# Patient Record
Sex: Female | Born: 1980 | Race: Black or African American | Hispanic: No | Marital: Single | Smoking: Current every day smoker
Health system: Southern US, Community
[De-identification: ages and names within clinical notes are randomized; demographics above are authoritative.]

## PROBLEM LIST (undated history)

## (undated) DIAGNOSIS — A549 Gonococcal infection, unspecified: Secondary | ICD-10-CM

## (undated) HISTORY — PX: DILATION AND CURETTAGE OF UTERUS: SHX78

---
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
## 2008-08-30 ENCOUNTER — Emergency Department (HOSPITAL_BASED_OUTPATIENT_CLINIC_OR_DEPARTMENT_OTHER): Admission: EM | Admit: 2008-08-30 | Discharge: 2008-08-30 | Payer: Self-pay | Admitting: Emergency Medicine

## 2008-09-30 ENCOUNTER — Emergency Department (HOSPITAL_BASED_OUTPATIENT_CLINIC_OR_DEPARTMENT_OTHER): Admission: EM | Admit: 2008-09-30 | Discharge: 2008-09-30 | Payer: Self-pay | Admitting: *Deleted

## 2008-10-18 ENCOUNTER — Emergency Department (HOSPITAL_BASED_OUTPATIENT_CLINIC_OR_DEPARTMENT_OTHER): Admission: EM | Admit: 2008-10-18 | Discharge: 2008-10-18 | Payer: Self-pay | Admitting: Emergency Medicine

## 2008-11-26 ENCOUNTER — Ambulatory Visit: Payer: Self-pay | Admitting: Diagnostic Radiology

## 2008-11-26 ENCOUNTER — Emergency Department (HOSPITAL_BASED_OUTPATIENT_CLINIC_OR_DEPARTMENT_OTHER): Admission: EM | Admit: 2008-11-26 | Discharge: 2008-11-27 | Payer: Self-pay | Admitting: Emergency Medicine

## 2008-12-04 ENCOUNTER — Emergency Department (HOSPITAL_BASED_OUTPATIENT_CLINIC_OR_DEPARTMENT_OTHER): Admission: EM | Admit: 2008-12-04 | Discharge: 2008-12-04 | Payer: Self-pay | Admitting: Emergency Medicine

## 2008-12-06 ENCOUNTER — Emergency Department (HOSPITAL_BASED_OUTPATIENT_CLINIC_OR_DEPARTMENT_OTHER): Admission: EM | Admit: 2008-12-06 | Discharge: 2008-12-06 | Payer: Self-pay | Admitting: Emergency Medicine

## 2009-01-03 ENCOUNTER — Emergency Department (HOSPITAL_BASED_OUTPATIENT_CLINIC_OR_DEPARTMENT_OTHER): Admission: EM | Admit: 2009-01-03 | Discharge: 2009-01-03 | Payer: Self-pay | Admitting: Emergency Medicine

## 2009-12-26 ENCOUNTER — Emergency Department (HOSPITAL_BASED_OUTPATIENT_CLINIC_OR_DEPARTMENT_OTHER): Admission: EM | Admit: 2009-12-26 | Discharge: 2009-12-26 | Payer: Self-pay | Admitting: Emergency Medicine

## 2010-01-25 IMAGING — CR DG WRIST COMPLETE 3+V*R*
4 series · 4 of 4 positions shown · non-contrast
Comparison: None available.

CLINICAL DATA: Injury, pain.

RIGHT WRIST - COMPLETE 3+ VIEW

[x wrist lat right]
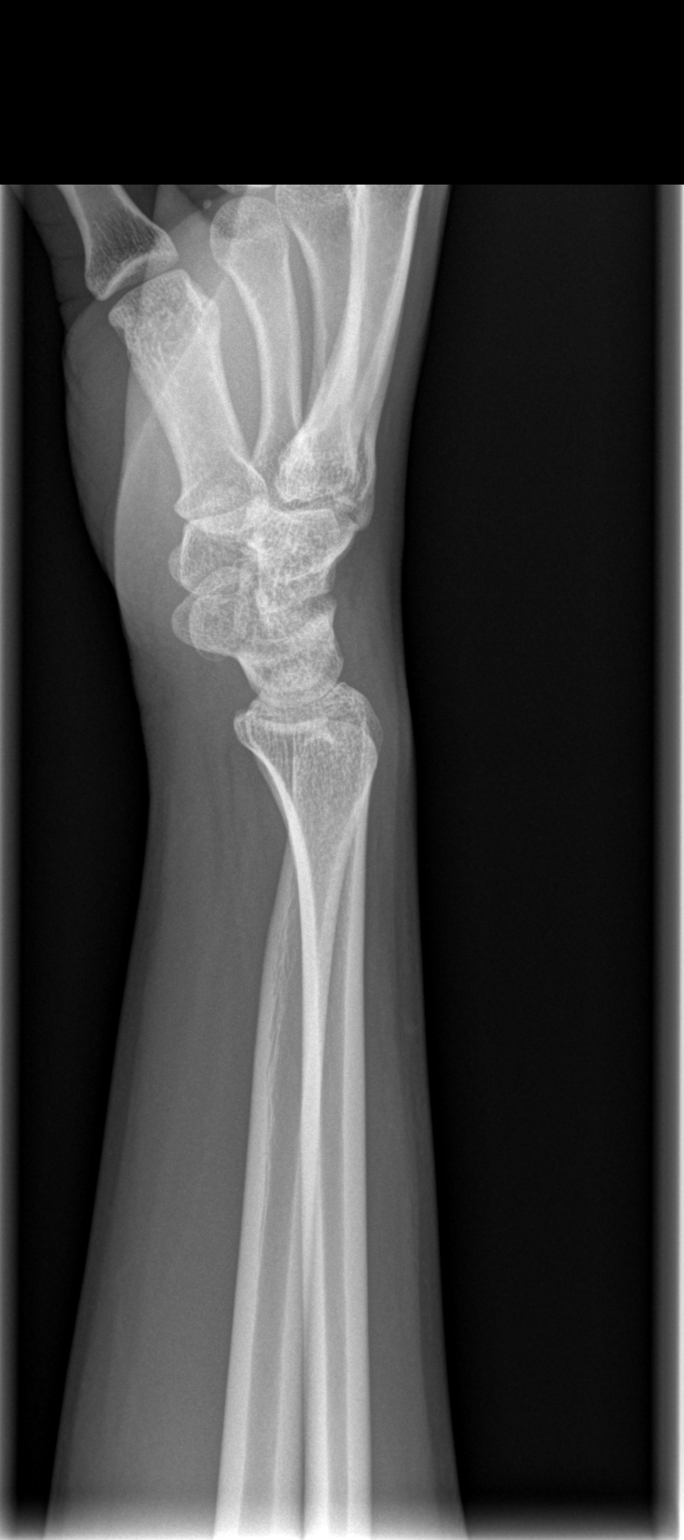

[x wrist pa right]
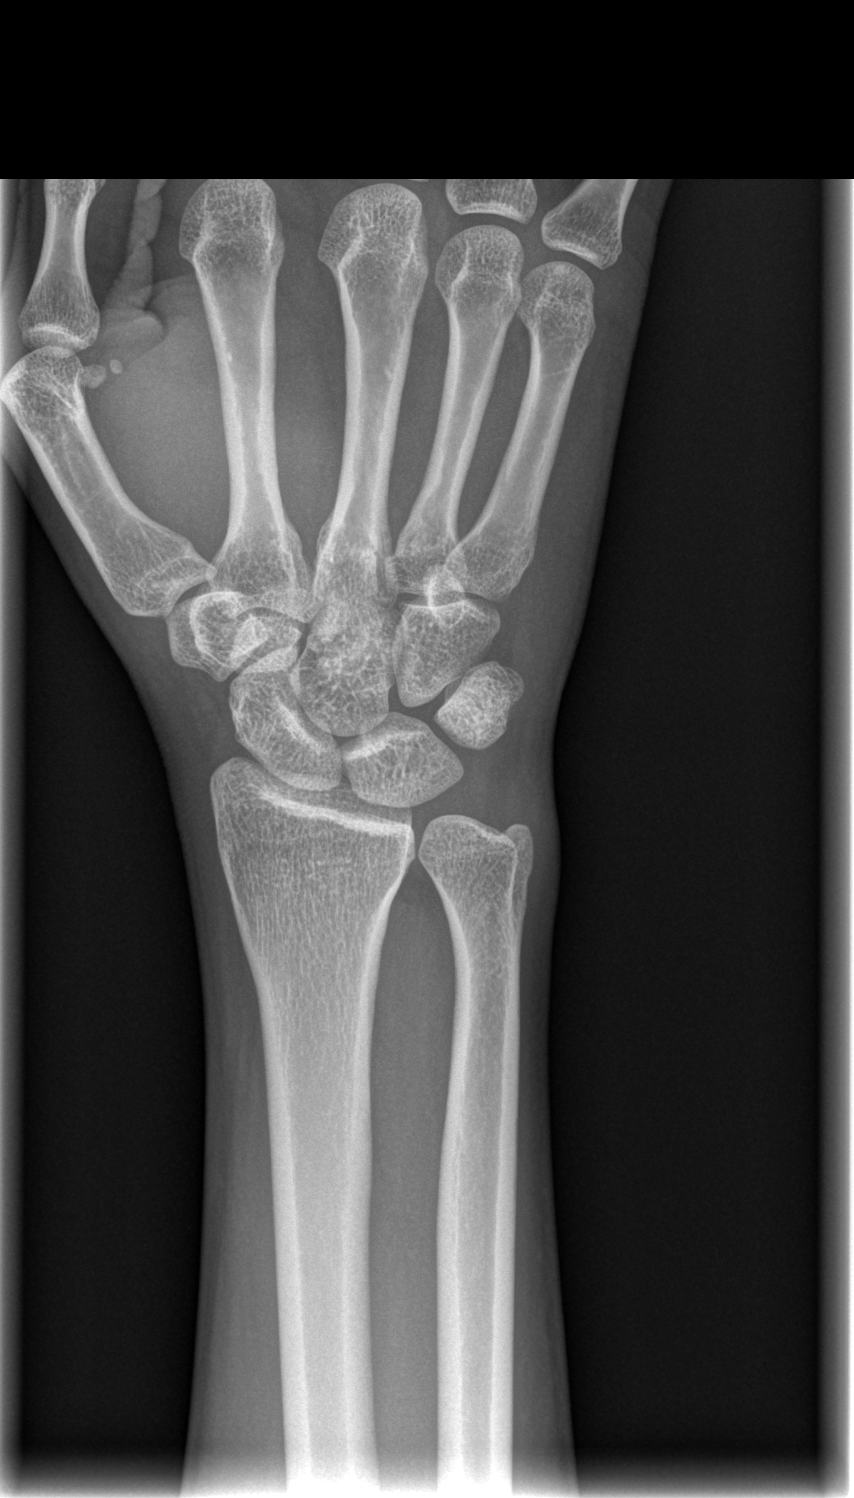

[x wrist obl right]
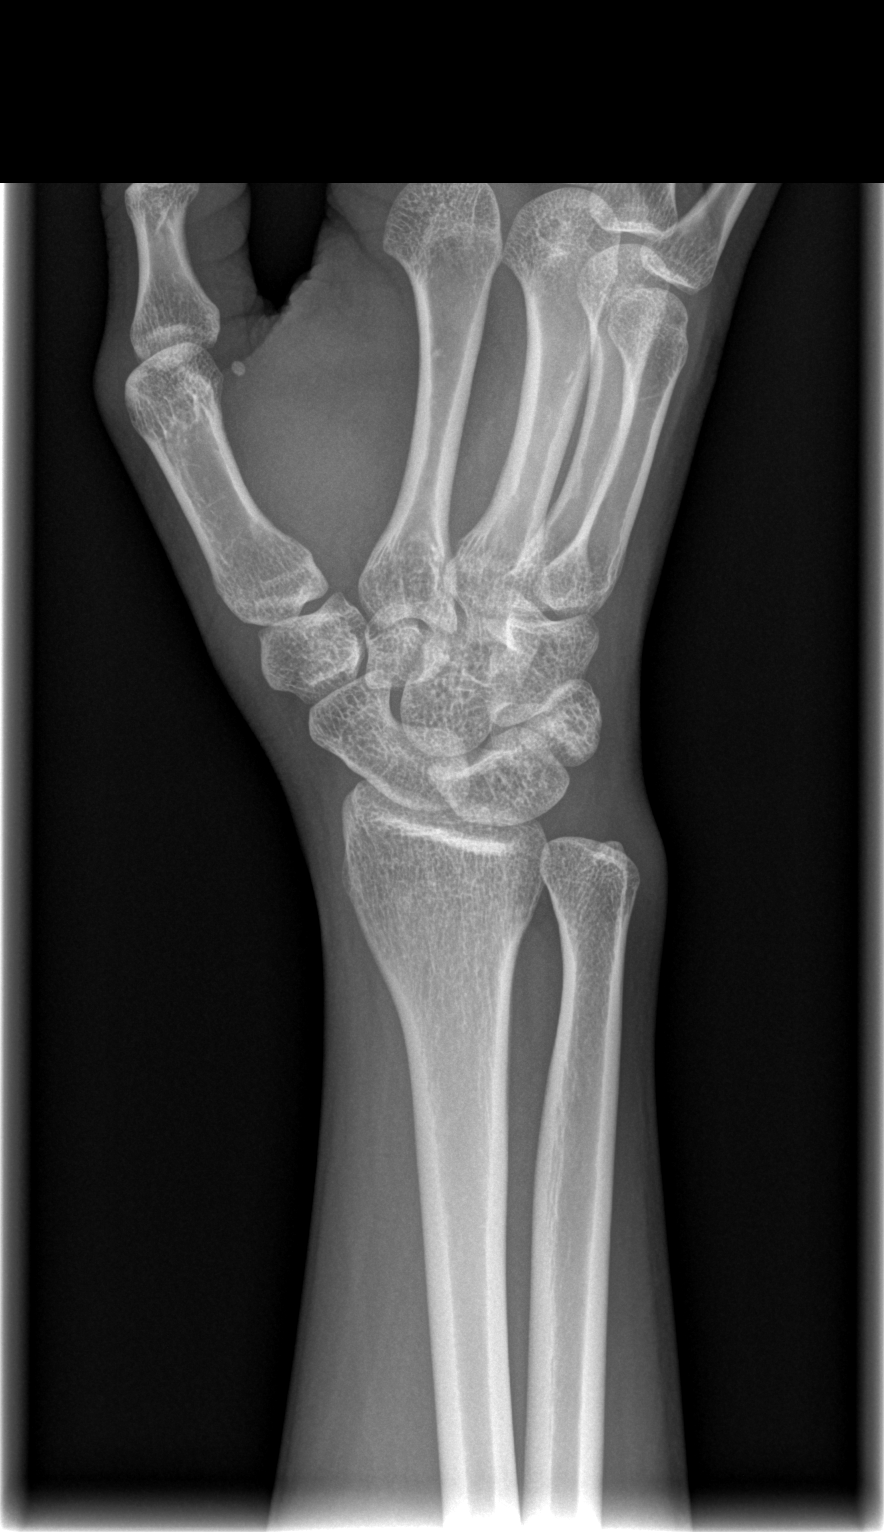

[x navicular]
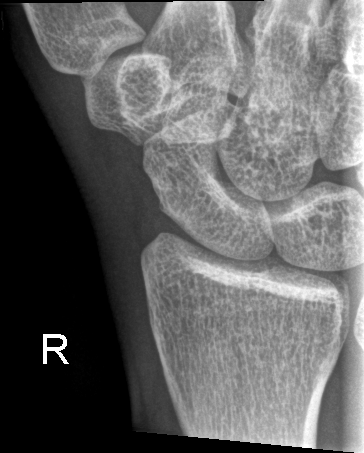

[4 of 4 positions shown; findings below may reference images not displayed]

FINDINGS: Imaged bones, joints and soft tissues appear normal.
IMPRESSION: Negative exam.

## 2010-01-25 IMAGING — CR DG HAND COMPLETE 3+V*R*
3 series · 3 of 3 positions shown · non-contrast
Comparison: None available.

CLINICAL DATA: Right hand injury and pain.

RIGHT HAND - COMPLETE 3+ VIEW

[x hand pa right]
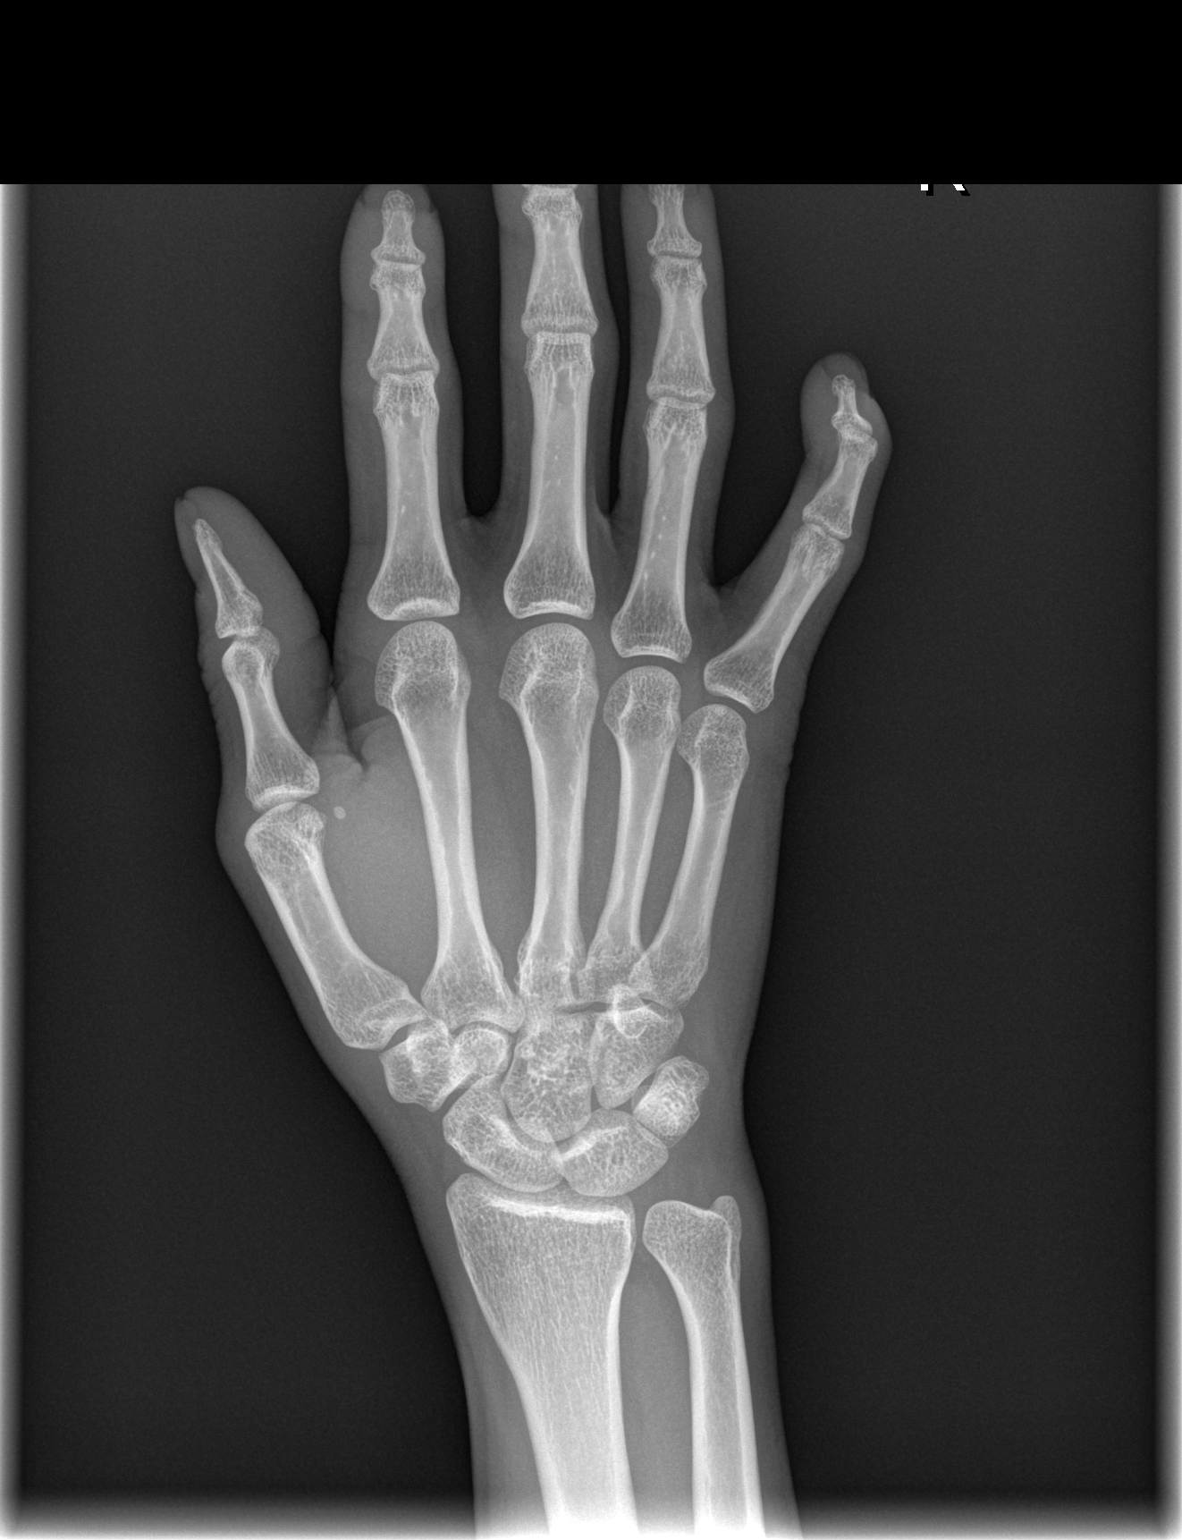

[x hand oblique right]
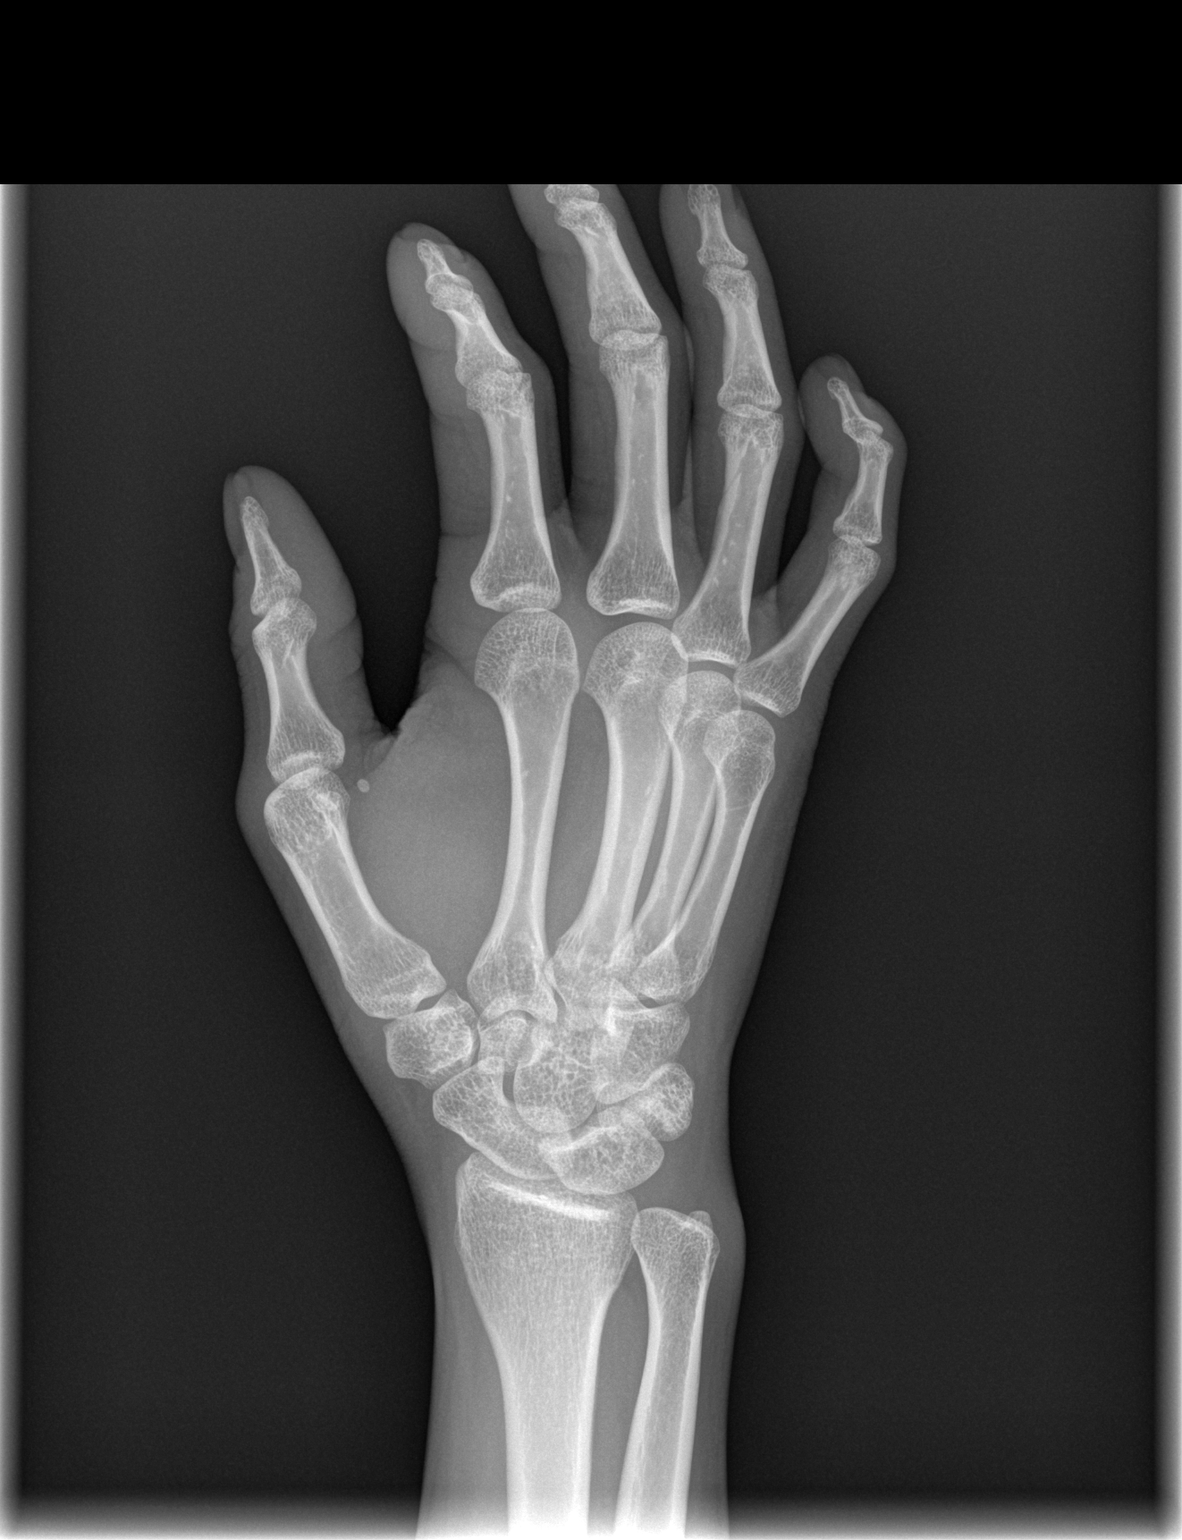

[x hand lat right]
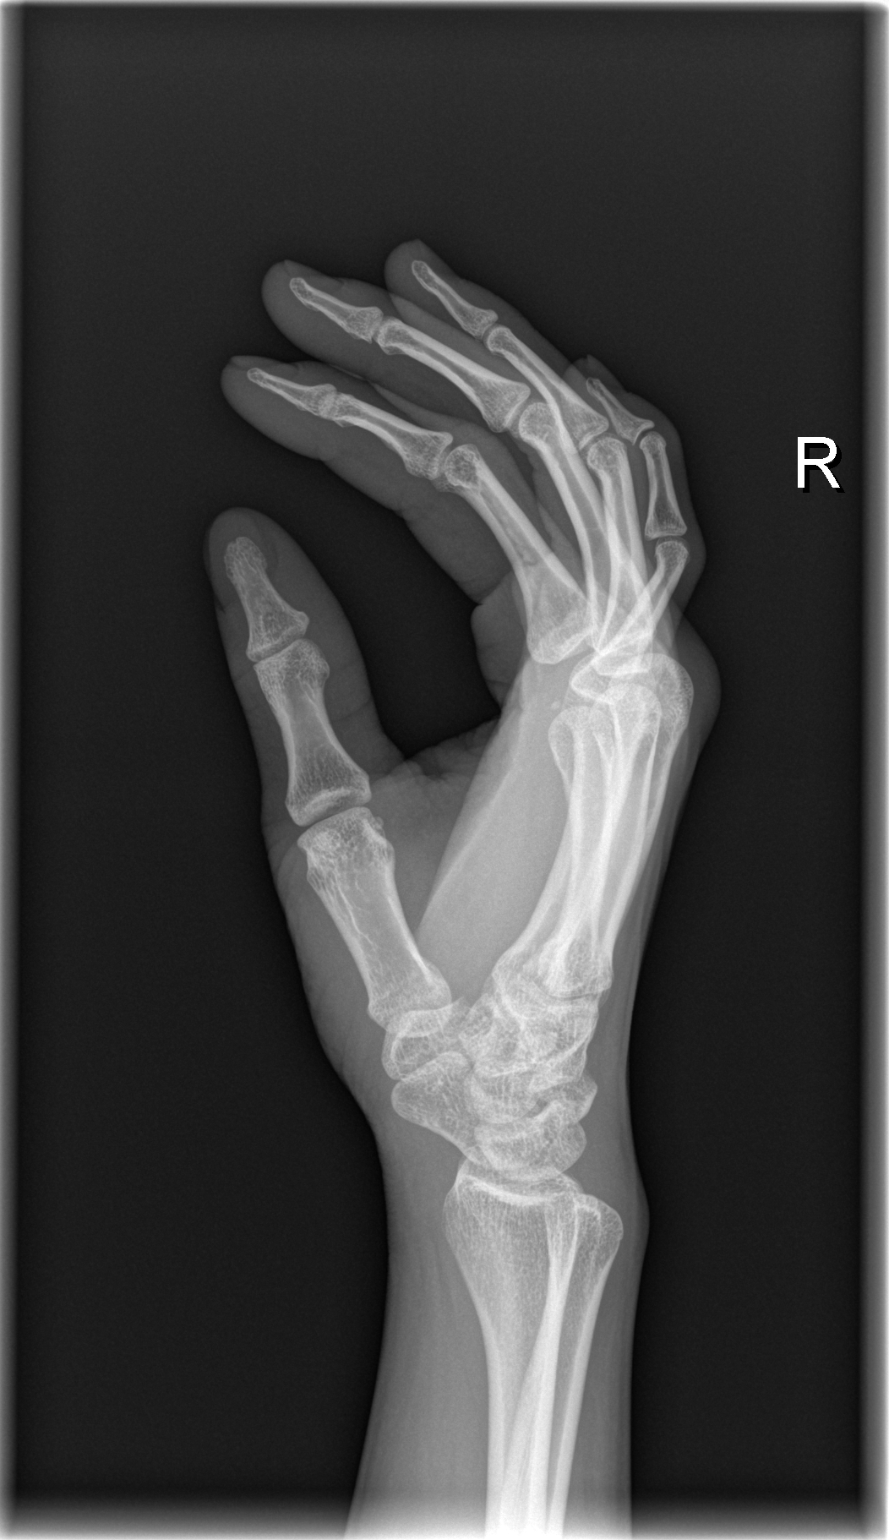

[3 of 3 positions shown; findings below may reference images not displayed]

FINDINGS: Imaged bones, joints and soft tissues appear normal.
IMPRESSION: Negative exam.

## 2010-06-02 LAB — URINALYSIS, ROUTINE W REFLEX MICROSCOPIC
Glucose, UA: NEGATIVE mg/dL
Ketones, ur: NEGATIVE mg/dL
Nitrite: NEGATIVE
Protein, ur: NEGATIVE mg/dL
Urobilinogen, UA: 0.2 mg/dL (ref 0.0–1.0)

## 2010-06-02 LAB — PREGNANCY, URINE: Preg Test, Ur: NEGATIVE

## 2010-06-02 LAB — URINE MICROSCOPIC-ADD ON

## 2011-11-28 ENCOUNTER — Encounter (HOSPITAL_BASED_OUTPATIENT_CLINIC_OR_DEPARTMENT_OTHER): Payer: Self-pay | Admitting: Emergency Medicine

## 2011-11-28 ENCOUNTER — Emergency Department (HOSPITAL_BASED_OUTPATIENT_CLINIC_OR_DEPARTMENT_OTHER)
Admission: EM | Admit: 2011-11-28 | Discharge: 2011-11-28 | Disposition: A | Discharge status: Home / Self Care | Payer: Self-pay | Attending: Emergency Medicine | Admitting: Emergency Medicine

## 2011-11-28 DIAGNOSIS — A599 Trichomoniasis, unspecified: Secondary | ICD-10-CM

## 2011-11-28 DIAGNOSIS — B9689 Other specified bacterial agents as the cause of diseases classified elsewhere: Secondary | ICD-10-CM

## 2011-11-28 DIAGNOSIS — A499 Bacterial infection, unspecified: Secondary | ICD-10-CM | POA: Insufficient documentation

## 2011-11-28 DIAGNOSIS — N760 Acute vaginitis: Secondary | ICD-10-CM | POA: Insufficient documentation

## 2011-11-28 HISTORY — DX: Gonococcal infection, unspecified: A54.9

## 2011-11-28 LAB — URINALYSIS, ROUTINE W REFLEX MICROSCOPIC
Bilirubin Urine: NEGATIVE
Glucose, UA: NEGATIVE mg/dL
Hgb urine dipstick: NEGATIVE
Ketones, ur: NEGATIVE mg/dL
Nitrite: NEGATIVE
Protein, ur: NEGATIVE mg/dL
Specific Gravity, Urine: 1.006 (ref 1.005–1.030)
Urobilinogen, UA: 0.2 mg/dL (ref 0.0–1.0)
pH: 5.5 (ref 5.0–8.0)

## 2011-11-28 LAB — URINE MICROSCOPIC-ADD ON

## 2011-11-28 LAB — PREGNANCY, URINE: Preg Test, Ur: NEGATIVE

## 2011-11-28 LAB — WET PREP, GENITAL: Yeast Wet Prep HPF POC: NONE SEEN

## 2011-11-28 MED ORDER — METRONIDAZOLE 500 MG PO TABS: 500.0000 mg | ORAL_TABLET | Freq: Two times a day (BID) | ORAL | Status: AC

## 2011-11-28 NOTE — ED Notes (Signed)
MD at bedside. 

## 2011-11-28 NOTE — Discharge Instructions (Signed)
Bacterial Vaginosis Bacterial vaginosis (BV) is a vaginal infection where the normal balance of bacteria in the vagina is disrupted. The normal balance is then replaced by an overgrowth of certain bacteria. There are several different kinds of bacteria that can cause BV. BV is the most common vaginal infection in women of childbearing age. CAUSES   The cause of BV is not fully understood. BV develops when there is an increase or imbalance of harmful bacteria.   Some activities or behaviors can upset the normal balance of bacteria in the vagina and put women at increased risk including:   Having a new sex partner or multiple sex partners.   Douching.   Using an intrauterine device (IUD) for contraception.   It is not clear what role sexual activity plays in the development of BV. However, women that have never had sexual intercourse are rarely infected with BV.  Women do not get BV from toilet seats, bedding, swimming pools or from touching objects around them.  SYMPTOMS   Grey vaginal discharge.   A fish-like odor with discharge, especially after sexual intercourse.   Itching or burning of the vagina and vulva.   Burning or pain with urination.   Some women have no signs or symptoms at all.  DIAGNOSIS  Your caregiver must examine the vagina for signs of BV. Your caregiver will perform lab tests and look at the sample of vaginal fluid through a microscope. They will look for bacteria and abnormal cells (clue cells), a pH test higher than 4.5, and a positive amine test all associated with BV.  RISKS AND COMPLICATIONS   Pelvic inflammatory disease (PID).   Infections following gynecology surgery.   Developing HIV.   Developing herpes virus.  TREATMENT  Sometimes BV will clear up without treatment. However, all women with symptoms of BV should be treated to avoid complications, especially if gynecology surgery is planned. Female partners generally do not need to be treated. However,  BV may spread between female sex partners so treatment is helpful in preventing a recurrence of BV.   BV may be treated with antibiotics. The antibiotics come in either pill or vaginal cream forms. Either can be used with nonpregnant or pregnant women, but the recommended dosages differ. These antibiotics are not harmful to the baby.   BV can recur after treatment. If this happens, a second round of antibiotics will often be prescribed.   Treatment is important for pregnant women. If not treated, BV can cause a premature delivery, especially for a pregnant woman who had a premature birth in the past. All pregnant women who have symptoms of BV should be checked and treated.   For chronic reoccurrence of BV, treatment with a type of prescribed gel vaginally twice a week is helpful.  HOME CARE INSTRUCTIONS   Finish all medication as directed by your caregiver.   Do not have sex until treatment is completed.   Tell your sexual partner that you have a vaginal infection. They should see their caregiver and be treated if they have problems, such as a mild rash or itching.   Practice safe sex. Use condoms. Only have 1 sex partner.  PREVENTION  Basic prevention steps can help reduce the risk of upsetting the natural balance of bacteria in the vagina and developing BV:  Do not have sexual intercourse (be abstinent).   Do not douche.   Use all of the medicine prescribed for treatment of BV, even if the signs and symptoms go away.     Tell your sex partner if you have BV. That way, they can be treated, if needed, to prevent reoccurrence.  SEEK MEDICAL CARE IF:   Your symptoms are not improving after 3 days of treatment.   You have increased discharge, pain, or fever.  MAKE SURE YOU:   Understand these instructions.   Will watch your condition.   Will get help right away if you are not doing well or get worse.  FOR MORE INFORMATION  Division of STD Prevention (DSTDP), Centers for Disease  Control and Prevention: www.cdc.gov/std [employer] Association (ASHA): www.ashastd.org  Document Released: 03/06/2005 Document Revised: 02/23/2011 Document Reviewed: 08/27/2008 ExitCare Patient Information 2012 [employer].  Trichomoniasis Trichomoniasis is an infection, caused by the Trichomonas organism, that affects both women and men. In women, the outer female genitalia and the vagina are affected. In men, the penis is mainly affected, but the prostate and other reproductive organs can also be involved. Trichomoniasis is a sexually transmitted disease (STD) and is most often passed to another person through sexual contact. The majority of people who get trichomoniasis do so from a sexual encounter and are also at risk for other STDs. CAUSES   Sexual intercourse with an infected partner.   It can be present in swimming pools or hot tubs.  SYMPTOMS   Abnormal gray-green frothy vaginal discharge in women.   Vaginal itching and irritation in women.   Itching and irritation of the area outside the vagina in women.   Penile discharge with or without pain in males.   Inflammation of the urethra (urethritis), causing painful urination.   Bleeding after sexual intercourse.  RELATED COMPLICATIONS  Pelvic inflammatory disease.   Infection of the uterus (endometritis).   Infertility.   Tubal (ectopic) pregnancy.   It can be associated with other STDs, including gonorrhea and chlamydia, hepatitis B, and HIV.  COMPLICATIONS DURING PREGNANCY  Early (premature) delivery.   Premature rupture of the membranes (PROM).   Low birth weight.  DIAGNOSIS   Visualization of Trichomonas under the microscope from the vagina discharge.   Ph of the vagina greater than 4.5, tested with a test tape.   Trich Rapid Test.   Culture of the organism, but this is not usually needed.   It may be found on a Pap test.   Having a "strawberry cervix,"which means the cervix looks very  red like a strawberry.  TREATMENT   You may be given medication to fight the infection. Inform your caregiver if you could be or are pregnant. Some medications used to treat the infection should not be taken during pregnancy.   Over-the-counter medications or creams to decrease itching or irritation may be recommended.   Your sexual partner will need to be treated if infected.  HOME CARE INSTRUCTIONS   Take all medication prescribed by your caregiver.   Take over-the-counter medication for itching or irritation as directed by your caregiver.   Do not have sexual intercourse while you have the infection.   Do not douche or wear tampons.   Discuss your infection with your partner, as your partner may have acquired the infection from you. Or, your partner may have been the person who transmitted the infection to you.   Have your sex partner examined and treated if necessary.   Practice safe, informed, and protected sex.   See your caregiver for other STD testing.  SEEK MEDICAL CARE IF:   You still have symptoms after you finish the medication.   You have   an oral temperature above 102 F (38.9 C).   You develop belly (abdominal) pain.   You have pain when you urinate.   You have bleeding after sexual intercourse.   You develop a rash.   The medication makes you sick or makes you throw up (vomit).  Document Released: 08/30/2000 Document Revised: 02/23/2011 Document Reviewed: 09/25/2008 ExitCare Patient Information 2012 [employer]. 

## 2011-11-28 NOTE — ED Notes (Signed)
Pt c/o yellow vaginal discharge and vaginal burning.

## 2011-11-28 NOTE — ED Provider Notes (Signed)
History     CSN: [id number]  Arrival date & time 11/28/11  0212   First MD Initiated Contact with Patient 11/28/11 0335      Chief Complaint  Patient presents with  . Vaginal Discharge    (Consider location/radiation/quality/duration/timing/severity/associated sxs/prior treatment) HPI Comments: Ptt reports has had gonorrhea in the past.  No fever, pelvic pain.  Has no MD.  Same partner, no bleeding.    Patient is a 31 y.o. female presenting with vaginal discharge. The history is provided by the patient.  Vaginal Discharge The current episode started more than 1 week ago. The problem occurs constantly. The problem has not changed since onset.Pertinent negatives include no abdominal pain. Nothing aggravates the symptoms. Nothing relieves the symptoms. She has tried nothing for the symptoms. The treatment provided no relief.    Past Medical History  Diagnosis Date  . Gonorrhea     History reviewed. No pertinent past surgical history.  History reviewed. No pertinent family history.  History  Substance Use Topics  . Smoking status: Not on file  . Smokeless tobacco: Not on file  . Alcohol Use:     OB History    Grav Para Term Preterm Abortions TAB SAB Ect Mult Living                  Review of Systems  Constitutional: Negative for fever and chills.  Gastrointestinal: Negative for nausea, vomiting and abdominal pain.  Genitourinary: Positive for vaginal discharge. Negative for pelvic pain.  Musculoskeletal: Negative for back pain.  Skin: Negative for rash.    Allergies  Review of patient's allergies indicates no known allergies.  Home Medications   Current Outpatient Rx  Name Route Sig Dispense Refill  . HYDRALAZINE HCL 10 MG PO TABS Oral Take by mouth.    . METRONIDAZOLE 500 MG PO TABS Oral Take 1 tablet (500 mg total) by mouth 2 (two) times daily. 20 tablet 0    BP 120/71  Pulse 110  Temp 98.1 F (36.7 C) (Oral)  Resp 20  Ht 5\' 3"  (1.6 m)  Wt 104 lb  (47.174 kg)  BMI 18.42 kg/m2  SpO2 100%  Physical Exam  Nursing note and vitals reviewed. Constitutional: She appears well-developed and well-nourished. No distress.  Abdominal: Soft. She exhibits no distension. There is no tenderness. There is no rebound and no guarding.  Genitourinary: No breast swelling or tenderness. Pelvic exam was performed with patient prone. There is no rash on the right labia. There is no rash on the left labia. Cervix exhibits discharge and friability. Right adnexum displays no tenderness. Left adnexum displays no tenderness. No erythema, tenderness or bleeding around the vagina. No foreign body around the vagina. No signs of injury around the vagina. Vaginal discharge found.       Mild thick white discharge in vagina, minimal yellowish discharge from cervix  Skin: Skin is warm and dry. No rash noted.    ED Course  Procedures (including critical care time)  Labs Reviewed  URINALYSIS, ROUTINE W REFLEX MICROSCOPIC - Abnormal; Notable for the following:    APPearance CLOUDY (*)     Leukocytes, UA LARGE (*)     All other components within normal limits  URINE MICROSCOPIC-ADD ON - Abnormal; Notable for the following:    Squamous Epithelial / LPF MANY (*)     All other components within normal limits  WET PREP, GENITAL - Abnormal; Notable for the following:    Trich, Wet Prep FEW (*)  Clue Cells Wet Prep HPF POC MODERATE (*)     WBC, Wet Prep HPF POC MANY (*)     All other components within normal limits  PREGNANCY, URINE  GC/CHLAMYDIA PROBE AMP, GENITAL   No results found.   1. Trichimoniasis   2. BV (bacterial vaginosis)       MDM  No fever, not sig tender.  Wet prep and cultures sent.  Pt told that cultures would be done in 1-2 days, would be called in if she has STD.  Low suspicion.          Gavin Pound. Oletta Lamas, MD 11/28/11 [phone]

## 2011-11-28 NOTE — ED Notes (Signed)
Pt states "I think I have gonorrhea". Denies any urinary sxs.

## 2011-11-29 LAB — GC/CHLAMYDIA PROBE AMP, GENITAL
Chlamydia, DNA Probe: NEGATIVE
GC Probe Amp, Genital: NEGATIVE

## 2015-07-24 ENCOUNTER — Emergency Department (HOSPITAL_BASED_OUTPATIENT_CLINIC_OR_DEPARTMENT_OTHER)
Admission: EM | Admit: 2015-07-24 | Discharge: 2015-07-24 | Disposition: A | Discharge status: Home / Self Care | Payer: Medicaid Other | Attending: Emergency Medicine | Admitting: Emergency Medicine

## 2015-07-24 ENCOUNTER — Encounter (HOSPITAL_BASED_OUTPATIENT_CLINIC_OR_DEPARTMENT_OTHER): Payer: Self-pay | Admitting: Emergency Medicine

## 2015-07-24 DIAGNOSIS — R1013 Epigastric pain: Secondary | ICD-10-CM | POA: Insufficient documentation

## 2015-07-24 DIAGNOSIS — F17210 Nicotine dependence, cigarettes, uncomplicated: Secondary | ICD-10-CM | POA: Insufficient documentation

## 2015-07-24 DIAGNOSIS — Z79899 Other long term (current) drug therapy: Secondary | ICD-10-CM | POA: Insufficient documentation

## 2015-07-24 DIAGNOSIS — R112 Nausea with vomiting, unspecified: Secondary | ICD-10-CM

## 2015-07-24 DIAGNOSIS — R197 Diarrhea, unspecified: Secondary | ICD-10-CM | POA: Insufficient documentation

## 2015-07-24 LAB — CBC WITH DIFFERENTIAL/PLATELET
BASOS PCT: 0 %
Basophils Absolute: 0 10*3/uL (ref 0.0–0.1)
EOS ABS: 0.1 10*3/uL (ref 0.0–0.7)
Eosinophils Relative: 2 %
HCT: 41.3 % (ref 36.0–46.0)
Hemoglobin: 13.8 g/dL (ref 12.0–15.0)
Lymphocytes Relative: 21 %
Lymphs Abs: 1.3 10*3/uL (ref 0.7–4.0)
MCH: 28.6 pg (ref 26.0–34.0)
MCHC: 33.4 g/dL (ref 30.0–36.0)
MCV: 85.7 fL (ref 78.0–100.0)
MONO ABS: 0.4 10*3/uL (ref 0.1–1.0)
MONOS PCT: 6 %
Neutro Abs: 4.3 10*3/uL (ref 1.7–7.7)
Neutrophils Relative %: 71 %
Platelets: 250 10*3/uL (ref 150–400)
RBC: 4.82 MIL/uL (ref 3.87–5.11)
RDW: 13.5 % (ref 11.5–15.5)
WBC: 6.1 10*3/uL (ref 4.0–10.5)

## 2015-07-24 LAB — URINALYSIS, ROUTINE W REFLEX MICROSCOPIC
Bilirubin Urine: NEGATIVE
Glucose, UA: NEGATIVE mg/dL
Hgb urine dipstick: NEGATIVE
KETONES UR: NEGATIVE mg/dL
LEUKOCYTES UA: NEGATIVE
NITRITE: NEGATIVE
PH: 8 (ref 5.0–8.0)
Protein, ur: NEGATIVE mg/dL
Specific Gravity, Urine: 1.011 (ref 1.005–1.030)

## 2015-07-24 LAB — HCG, SERUM, QUALITATIVE: Preg, Serum: NEGATIVE

## 2015-07-24 LAB — COMPREHENSIVE METABOLIC PANEL
ALBUMIN: 3.6 g/dL (ref 3.5–5.0)
ALT: 13 U/L — ABNORMAL LOW (ref 14–54)
ANION GAP: 7 (ref 5–15)
AST: 20 U/L (ref 15–41)
Alkaline Phosphatase: 60 U/L (ref 38–126)
BUN: 8 mg/dL (ref 6–20)
CO2: 27 mmol/L (ref 22–32)
Calcium: 8.9 mg/dL (ref 8.9–10.3)
Chloride: 103 mmol/L (ref 101–111)
Creatinine, Ser: 0.8 mg/dL (ref 0.44–1.00)
GFR calc Af Amer: >60 mL/min (ref >60)
GFR calc non Af Amer: >60 mL/min (ref >60)
GLUCOSE: 93 mg/dL (ref 65–99)
POTASSIUM: 3.9 mmol/L (ref 3.5–5.1)
SODIUM: 137 mmol/L (ref 135–145)
TOTAL PROTEIN: 6.7 g/dL (ref 6.5–8.1)
Total Bilirubin: 0.2 mg/dL — ABNORMAL LOW (ref 0.3–1.2)

## 2015-07-24 LAB — LIPASE, BLOOD: Lipase: 19 U/L (ref 11–51)

## 2015-07-24 MED ORDER — FAMOTIDINE 20 MG PO TABS: 20.0000 mg | ORAL_TABLET | Freq: Two times a day (BID) | ORAL | Status: AC

## 2015-07-24 MED ORDER — GI COCKTAIL ~~LOC~~
30.0000 mL | Freq: Once | ORAL | Status: AC
  Administered 2015-07-24: 30 mL via ORAL
  Filled 2015-07-24: qty 30

## 2015-07-24 MED ORDER — FAMOTIDINE IN NACL 20-0.9 MG/50ML-% IV SOLN
20.0000 mg | Freq: Once | INTRAVENOUS | Status: AC
  Administered 2015-07-24: 20 mg via INTRAVENOUS
  Filled 2015-07-24: qty 50

## 2015-07-24 MED ORDER — ONDANSETRON HCL 4 MG PO TABS: 4.0000 mg | ORAL_TABLET | Freq: Three times a day (TID) | ORAL | Status: AC | PRN

## 2015-07-24 MED ORDER — ACETAMINOPHEN 325 MG PO TABS
650.0000 mg | ORAL_TABLET | Freq: Once | ORAL | Status: AC
  Administered 2015-07-24: 650 mg via ORAL
  Filled 2015-07-24: qty 2

## 2015-07-24 MED ORDER — ONDANSETRON HCL 4 MG/2ML IJ SOLN
4.0000 mg | Freq: Once | INTRAMUSCULAR | Status: AC
  Administered 2015-07-24: 4 mg via INTRAVENOUS
  Filled 2015-07-24: qty 2

## 2015-07-24 MED ORDER — SODIUM CHLORIDE 0.9 % IV BOLUS (SEPSIS)
1000.0000 mL | Freq: Once | INTRAVENOUS | Status: AC
  Administered 2015-07-24: 1000 mL via INTRAVENOUS

## 2015-07-24 NOTE — ED Notes (Signed)
Pt reports severe abd cramps with menstrual cycle. [facility] (reports having CT scan and blood work at that time and states they said everything was negative). Pt reports she's seeking care today d/t constant pain, n/v/d. Reports dark stool. Denies fever.

## 2015-07-24 NOTE — ED Notes (Signed)
MD at bedside. 

## 2015-07-24 NOTE — Discharge Instructions (Signed)
Your blood work today is very reassuring. Please continue to drink plenty of fluids and get plenty of rest at home. Take medications as prescribed. Return for worsening symptoms, including fever, worsening pain, vomiting and unable to keep down food or fluids despite nausea medications, or any other symptoms concerning to you.  Nausea and Vomiting Nausea is a sick feeling that often comes before throwing up (vomiting). Vomiting is a reflex where stomach contents come out of your mouth. Vomiting can cause severe loss of body fluids (dehydration). Children and elderly adults can become dehydrated quickly, especially if they also have diarrhea. Nausea and vomiting are symptoms of a condition or disease. It is important to find the cause of your symptoms. CAUSES   Direct irritation of the stomach lining. This irritation can result from increased acid production (gastroesophageal reflux disease), infection, food poisoning, taking certain medicines (such as nonsteroidal anti-inflammatory drugs), alcohol use, or tobacco use.  Signals from the brain.These signals could be caused by a headache, heat exposure, an inner ear disturbance, increased pressure in the brain from injury, infection, a tumor, or a concussion, pain, emotional stimulus, or metabolic problems.  An obstruction in the gastrointestinal tract (bowel obstruction).  Illnesses such as diabetes, hepatitis, gallbladder problems, appendicitis, kidney problems, cancer, sepsis, atypical symptoms of a heart attack, or eating disorders.  Medical treatments such as chemotherapy and radiation.  Receiving medicine that makes you sleep (general anesthetic) during surgery. DIAGNOSIS Your caregiver may ask for tests to be done if the problems do not improve after a few days. Tests may also be done if symptoms are severe or if the reason for the nausea and vomiting is not clear. Tests may include:  Urine tests.  Blood tests.  Stool tests.  Cultures  (to look for evidence of infection).  X-rays or other imaging studies. Test results can help your caregiver make decisions about treatment or the need for additional tests. TREATMENT You need to stay well hydrated. Drink frequently but in small amounts.You may wish to drink water, sports drinks, clear broth, or eat frozen ice pops or gelatin dessert to help stay hydrated.When you eat, eating slowly may help prevent nausea.There are also some antinausea medicines that may help prevent nausea. HOME CARE INSTRUCTIONS   Take all medicine as directed by your caregiver.  If you do not have an appetite, do not force yourself to eat. However, you must continue to drink fluids.  If you have an appetite, eat a normal diet unless your caregiver tells you differently.  Eat a variety of complex carbohydrates (rice, wheat, potatoes, bread), lean meats, yogurt, fruits, and vegetables.  Avoid high-fat foods because they are more difficult to digest.  Drink enough water and fluids to keep your urine clear or pale yellow.  If you are dehydrated, ask your caregiver for specific rehydration instructions. Signs of dehydration may include:  Severe thirst.  Dry lips and mouth.  Dizziness.  Dark urine.  Decreasing urine frequency and amount.  Confusion.  Rapid breathing or pulse. SEEK IMMEDIATE MEDICAL CARE IF:   You have blood or brown flecks (like coffee grounds) in your vomit.  You have black or bloody stools.  You have a severe headache or stiff neck.  You are confused.  You have severe abdominal pain.  You have chest pain or trouble breathing.  You do not urinate at least once every 8 hours.  You develop cold or clammy skin.  You continue to vomit for longer than 24 to 48 hours.  You have a fever. MAKE SURE YOU:   Understand these instructions.  Will watch your condition.  Will get help right away if you are not doing well or get worse.   This information is not  intended to replace advice given to you by your health care provider. Make sure you discuss any questions you have with your health care provider.   Document Released: 03/06/2005 Document Revised: 05/29/2011 Document Reviewed: 08/03/2010 Elsevier Interactive Patient Education 2016 [employer]  Diarrhea Diarrhea is watery poop (stool). It can make you feel weak, tired, thirsty, or give you a dry mouth (signs of dehydration). Watery poop is a sign of another problem, most often an infection. It often lasts 2-3 days. It can last longer if it is a sign of something serious. Take care of yourself as told by your doctor. HOME CARE   Drink 1 cup (8 ounces) of fluid each time you have watery poop.  Do not drink the following fluids:  Those that contain simple sugars (fructose, glucose, galactose, lactose, sucrose, maltose).  Sports drinks.  Fruit juices.  Whole milk products.  Sodas.  Drinks with caffeine (coffee, tea, soda) or alcohol.  Oral rehydration solution may be used if the doctor says it is okay. You may make your own solution. Follow this recipe:   - teaspoon table salt.   teaspoon baking soda.   teaspoon salt substitute containing potassium chloride.  1 tablespoons sugar.  1 liter (34 ounces) of water.  Avoid the following foods:  High fiber foods, such as raw fruits and vegetables.  Nuts, seeds, and whole grain breads and cereals.   Those that are sweetened with sugar alcohols (xylitol, sorbitol, mannitol).  Try eating the following foods:  Starchy foods, such as rice, toast, pasta, low-sugar cereal, oatmeal, baked potatoes, crackers, and bagels.  Bananas.  Applesauce.  Eat probiotic-rich foods, such as yogurt and milk products that are fermented.  Wash your hands well after each time you have watery poop.  Only take medicine as told by your doctor.  Take a warm bath to help lessen burning or pain from having watery poop. GET HELP RIGHT AWAY IF:    You cannot drink fluids without throwing up (vomiting).  You keep throwing up.  You have blood in your poop, or your poop looks black and tarry.  You do not pee (urinate) in 6-8 hours, or there is only a small amount of very dark pee.  You have belly (abdominal) pain that gets worse or stays in the same spot (localizes).  You are weak, dizzy, confused, or light-headed.  You have a very bad headache.  Your watery poop gets worse or does not get better.  You have a fever or lasting symptoms for more than 2-3 days.  You have a fever and your symptoms suddenly get worse. MAKE SURE YOU:   Understand these instructions.  Will watch your condition.  Will get help right away if you are not doing well or get worse.   This information is not intended to replace advice given to you by your health care provider. Make sure you discuss any questions you have with your health care provider.   Document Released: 08/23/2007 Document Revised: 03/27/2014 Document Reviewed: 11/12/2011 Elsevier Interactive Patient Education [employer]

## 2015-07-24 NOTE — ED Provider Notes (Signed)
CSN: [id number]     Arrival date & time 07/24/15  [id number] History   First MD Initiated Contact with Patient 07/24/15 0900     No chief complaint on file.    (Consider location/radiation/quality/duration/timing/severity/associated sxs/prior Treatment) HPI 35 year old female who presents with abdominal pain. She has no significant abdominal surgeries and otherwise healthy. States that she was [facility] 5 days ago for evaluation of severe abdominal cramping in the setting of her menstrual cycle. She reports that she had a CT scan that was performed that was unremarkable with unremarkable blood work. She says that that pain has significantly improved and resolved, and her menses has resulted not only having mild spotting. This morning woke up with 4 episodes of non-bilious, nonbloody emesis with multiple episodes of diarrhea. Has had a burning pain in the center of her abdomen. Pain not associated with eating. No fevers or chills. No dysuria, urinary frequency, flank pain, chest pain or difficulty breathing, or other upper respiratory symptoms. No known sick contacts. Past Medical History  Diagnosis Date  . Gonorrhea    Past Surgical History  Procedure Laterality Date  . Dilation and curettage of uterus  two in 2008  . Scalp lesion removal w/ flap and skin graft  1998   History reviewed. No pertinent family history. Social History  Substance Use Topics  . Smoking status: Current Every Day Smoker -- 0.50 packs/day    Types: Cigarettes  . Smokeless tobacco: Never Used  . Alcohol Use: 8.4 oz/week    14 Cans of beer per week     Comment: 2 beers daily   OB History    No data available     Review of Systems 10/14 systems reviewed and are negative other than those stated in the HPI    Allergies  Tomato  Home Medications   Prior to Admission medications   Medication Sig Start Date End Date Taking? Authorizing Provider  hydrOXYzine (ATARAX/VISTARIL) 10 MG tablet Take 10  mg by mouth 3 (three) times daily as needed.   Yes Historical Provider, MD  famotidine (PEPCID) 20 MG tablet Take 1 tablet (20 mg total) by mouth 2 (two) times daily. 07/24/15   Lavera Guiseana Duo Lorre Opdahl, MD  ondansetron (ZOFRAN) 4 MG tablet Take 1 tablet (4 mg total) by mouth every 8 (eight) hours as needed for nausea or vomiting. 07/24/15   Lavera Guiseana Duo Juliona Vales, MD   BP 143/85 mmHg  Pulse 70  Temp(Src) 98.3 F (36.8 C) (Oral)  Resp 18  Ht 5\' 3"  (1.6 m)  Wt 106 lb (48.081 kg)  BMI 18.78 kg/m2  SpO2 100%  LMP 07/18/2015 (Exact Date) Physical Exam Physical Exam  Nursing note and vitals reviewed. Constitutional: Well developed, well nourished, non-toxic, and in no acute distress Head: Normocephalic and atraumatic.  Mouth/Throat: Oropharynx is clear and moist.  Neck: Normal range of motion. Neck supple.  Cardiovascular: Normal rate and regular rhythm.   Pulmonary/Chest: Effort normal and breath sounds normal.  Abdominal: Soft. There is no tenderness. There is no rebound and no guarding.  Musculoskeletal: Normal range of motion.  Neurological: Alert, no facial droop, fluent speech, moves all extremities symmetrically Skin: Skin is warm and dry.  Psychiatric: Cooperative  ED Course  Procedures (including critical care time) Labs Review Labs Reviewed  COMPREHENSIVE METABOLIC PANEL - Abnormal; Notable for the following:    ALT 13 (*)    Total Bilirubin 0.2 (*)    All other components within normal limits  URINALYSIS, ROUTINE  W REFLEX MICROSCOPIC (NOT AT [facility]) - Abnormal; Notable for the following:    APPearance CLOUDY (*)    All other components within normal limits  CBC WITH DIFFERENTIAL/PLATELET  LIPASE, BLOOD  HCG, SERUM, QUALITATIVE    Imaging Review No results found. I have personally reviewed and evaluated these images and lab results as part of my medical decision-making.   EKG Interpretation None      MDM   Final diagnoses:  Nausea vomiting and diarrhea  Epigastric pain     35 year old female who presents with nausea, vomiting, diarrhea since this morning. She on presentation is nontoxic and in no acute distress. Her vital signs are non-concerning. She has a soft and benign abdomen. With palpation she says that her burning pain is significantly improved. Localizes her burning pain to the epigastrium. Given her benign exam, I do not suspect serious intra-abdominal process at this time. I think overall presentation seems consistent with that of likely benign GI illness. We will obtain basic blood work including CBC, comprehensive metabolic panel, lipase urinalysis and pregnancy test. She will receive supportive care with IV fluids,  antiemetics and Pepcid.  UA and pregnancy test negative. Unremarkable CBC, comprehensive metabolic panel and lipase. Given Pepcid, GI cocktail, anti-emetics and feels significantly improved. Able to tolerate by mouth challenge. Overall presentation suggestive of likely benign GI illness. Do not suspect serious intra-abdominal process at this time. Given course of Zofran and Pepcid for home. Discussed supportive care instructions for home. Strict return instructions are reviewed. She expressed understanding of all discharge instructions, and felt comfortable with the plan of care.  Lavera Guise, MD 07/24/15 [phone]

## 2015-07-24 NOTE — ED Notes (Signed)
Pt able to tolerate water with no n/v. 

## 2015-11-20 DIAGNOSIS — F17210 Nicotine dependence, cigarettes, uncomplicated: Secondary | ICD-10-CM | POA: Insufficient documentation

## 2015-11-20 DIAGNOSIS — K2970 Gastritis, unspecified, without bleeding: Secondary | ICD-10-CM | POA: Insufficient documentation

## 2015-11-21 ENCOUNTER — Encounter (HOSPITAL_BASED_OUTPATIENT_CLINIC_OR_DEPARTMENT_OTHER): Payer: Self-pay | Admitting: Emergency Medicine

## 2015-11-21 ENCOUNTER — Emergency Department (HOSPITAL_BASED_OUTPATIENT_CLINIC_OR_DEPARTMENT_OTHER)
Admission: EM | Admit: 2015-11-21 | Discharge: 2015-11-21 | Disposition: A | Discharge status: Home / Self Care | Payer: Medicaid Other | Attending: Emergency Medicine | Admitting: Emergency Medicine

## 2015-11-21 DIAGNOSIS — K2970 Gastritis, unspecified, without bleeding: Secondary | ICD-10-CM

## 2015-11-21 LAB — URINALYSIS, ROUTINE W REFLEX MICROSCOPIC
BILIRUBIN URINE: NEGATIVE
GLUCOSE, UA: NEGATIVE mg/dL
Leukocytes, UA: NEGATIVE
Nitrite: NEGATIVE
PROTEIN: NEGATIVE mg/dL
Specific Gravity, Urine: 1.025 (ref 1.005–1.030)
pH: 5.5 (ref 5.0–8.0)

## 2015-11-21 LAB — COMPREHENSIVE METABOLIC PANEL
ALK PHOS: 65 U/L (ref 38–126)
ALT: 18 U/L (ref 14–54)
AST: 20 U/L (ref 15–41)
Albumin: 4.2 g/dL (ref 3.5–5.0)
Anion gap: 10 (ref 5–15)
BUN: 10 mg/dL (ref 6–20)
CALCIUM: 8.9 mg/dL (ref 8.9–10.3)
CHLORIDE: 105 mmol/L (ref 101–111)
CO2: 22 mmol/L (ref 22–32)
CREATININE: 0.68 mg/dL (ref 0.44–1.00)
GFR calc non Af Amer: >60 mL/min (ref >60)
Glucose, Bld: 106 mg/dL — ABNORMAL HIGH (ref 65–99)
Potassium: 3.6 mmol/L (ref 3.5–5.1)
SODIUM: 137 mmol/L (ref 135–145)
Total Bilirubin: 0.8 mg/dL (ref 0.3–1.2)
Total Protein: 7.4 g/dL (ref 6.5–8.1)

## 2015-11-21 LAB — LIPASE, BLOOD: Lipase: 22 U/L (ref 11–51)

## 2015-11-21 LAB — URINE MICROSCOPIC-ADD ON

## 2015-11-21 LAB — CBC
HCT: 41.8 % (ref 36.0–46.0)
Hemoglobin: 14.1 g/dL (ref 12.0–15.0)
MCH: 28.8 pg (ref 26.0–34.0)
MCHC: 33.7 g/dL (ref 30.0–36.0)
MCV: 85.5 fL (ref 78.0–100.0)
PLATELETS: 262 10*3/uL (ref 150–400)
RBC: 4.89 MIL/uL (ref 3.87–5.11)
RDW: 13.2 % (ref 11.5–15.5)
WBC: 9.1 10*3/uL (ref 4.0–10.5)

## 2015-11-21 LAB — PREGNANCY, URINE: PREG TEST UR: NEGATIVE

## 2015-11-21 MED ORDER — METOCLOPRAMIDE HCL 5 MG/ML IJ SOLN
10.0000 mg | Freq: Once | INTRAMUSCULAR | Status: AC
  Administered 2015-11-21: 10 mg via INTRAVENOUS
  Filled 2015-11-21: qty 2

## 2015-11-21 MED ORDER — RANITIDINE HCL 150 MG PO CAPS: 150.0000 mg | ORAL_CAPSULE | Freq: Every day | ORAL | 0 refills | Status: AC

## 2015-11-21 MED ORDER — ONDANSETRON HCL 4 MG/2ML IJ SOLN
4.0000 mg | Freq: Once | INTRAMUSCULAR | Status: AC | PRN
  Administered 2015-11-21: 4 mg via INTRAVENOUS
  Filled 2015-11-21: qty 2

## 2015-11-21 MED ORDER — SUCRALFATE 1 G PO TABS
1.0000 g | ORAL_TABLET | Freq: Once | ORAL | Status: AC
  Administered 2015-11-21: 1 g via ORAL
  Filled 2015-11-21: qty 1

## 2015-11-21 MED ORDER — FAMOTIDINE IN NACL 20-0.9 MG/50ML-% IV SOLN
20.0000 mg | Freq: Once | INTRAVENOUS | Status: AC
  Administered 2015-11-21: 20 mg via INTRAVENOUS
  Filled 2015-11-21: qty 50

## 2015-11-21 MED ORDER — RANITIDINE HCL 150 MG/10ML PO SYRP
300.0000 mg | ORAL_SOLUTION | Freq: Once | ORAL | Status: AC
  Administered 2015-11-21: 300 mg via ORAL
  Filled 2015-11-21: qty 20

## 2015-11-21 MED ORDER — SODIUM CHLORIDE 0.9 % IV BOLUS (SEPSIS)
1000.0000 mL | Freq: Once | INTRAVENOUS | Status: AC
  Administered 2015-11-21: 1000 mL via INTRAVENOUS

## 2015-11-21 NOTE — ED Notes (Signed)
Pt sleeping/ resting, preoccupied with sleep[facility], [facility], calm, resps e/u, no dyspnea noted.

## 2015-11-21 NOTE — ED Notes (Signed)
Pt states she has a hx of ulcers and awoke this a.m. with generalized abd pain, N/V. Vomited x 6-7 times today. Denies urinary s/s or vaginal d/c.

## 2015-11-21 NOTE — ED Notes (Signed)
MD at bedside. 

## 2015-11-21 NOTE — ED Triage Notes (Signed)
Patient states that the last time she was here she was dx with an ulcer,. REports that today she started to have the burning pain again. She is moaning and rolling  around in the chair states that the pain is coming and going. Reports that she vomited x 1 today and it was yellow

## 2015-11-21 NOTE — ED Notes (Signed)
Entered room to find pt resting quietly on stretcher. Eyes closed.

## 2015-11-21 NOTE — ED Provider Notes (Signed)
[facility] Provider Note   CSN: [id number] Arrival date & time: 11/20/15  2350     History   Chief Complaint Chief Complaint  Patient presents with  . Abdominal Pain    HPI Carolyn Mcgrath is a 35 y.o. female no sig PMH, here with diffuse abdominal pain.  She states this began a couple of days ago and feels like her ulcer.  She does not take any medication for her ulcer.  She has vomited 10 times today.  She denies diarrhea, urinary, or vaginal complaints.  She has not had any fevers.  She is requesting pain medication.  Difficult to obtain further history.  10 Systems reviewed and are negative for acute change except as noted in the HPI.   HPI  Past Medical History:  Diagnosis Date  . Gonorrhea     There are no active problems to display for this patient.   Past Surgical History:  Procedure Laterality Date  . DILATION AND CURETTAGE OF UTERUS  two in 2008  . SCALP LESION REMOVAL W/ FLAP AND SKIN GRAFT  1998    OB History    No data available       Home Medications    Prior to Admission medications   Medication Sig Start Date End Date Taking? Authorizing Provider  famotidine (PEPCID) 20 MG tablet Take 1 tablet (20 mg total) by mouth 2 (two) times daily. 07/24/15   Lavera Guiseana Duo Liu, MD  hydrOXYzine (ATARAX/VISTARIL) 10 MG tablet Take 10 mg by mouth 3 (three) times daily as needed.    Historical Provider, MD  ondansetron (ZOFRAN) 4 MG tablet Take 1 tablet (4 mg total) by mouth every 8 (eight) hours as needed for nausea or vomiting. 07/24/15   Lavera Guiseana Duo Liu, MD  ranitidine (ZANTAC) 150 MG capsule Take 1 capsule (150 mg total) by mouth daily. 11/21/15   Tomasita CrumbleAdeleke Yiselle Babich, MD    Family History History reviewed. No pertinent family history.  Social History Social History  Substance Use Topics  . Smoking status: Current Every Day Smoker    Packs/day: 0.50    Types: Cigarettes  . Smokeless tobacco: Never Used  . Alcohol use 8.4 oz/week    14 Cans of beer per week   Comment: 2 beers daily     Allergies   Tomato   Review of Systems Review of Systems   Physical Exam Updated Vital Signs BP 128/87   Pulse (!) 53   Temp 98.8 F (37.1 C) (Oral)   Resp 18   Wt 103 lb (46.7 kg)   SpO2 100%   BMI 18.25 kg/m   Physical Exam  Constitutional: She is oriented to person, place, and time. She appears well-developed and well-nourished. No distress.  HENT:  Head: Normocephalic and atraumatic.  Nose: Nose normal.  Mouth/Throat: Oropharynx is clear and moist. No oropharyngeal exudate.  Eyes: Conjunctivae and EOM are normal. Pupils are equal, round, and reactive to light. No scleral icterus.  Neck: Normal range of motion. Neck supple. No JVD present. No tracheal deviation present. No thyromegaly present.  Cardiovascular: Normal rate, regular rhythm and normal heart sounds.  Exam reveals no gallop and no friction rub.   No murmur heard. Pulmonary/Chest: Effort normal and breath sounds normal. No respiratory distress. She has no wheezes. She exhibits no tenderness.  Abdominal: Soft. Bowel sounds are normal. She exhibits no distension and no mass. There is no tenderness. There is no rebound and no guarding.  Musculoskeletal: Normal range of motion. She  exhibits no edema or tenderness.  Lymphadenopathy:    She has no cervical adenopathy.  Neurological: She is alert and oriented to person, place, and time. No cranial nerve deficit. She exhibits normal muscle tone.  Skin: Skin is warm and dry. No rash noted. No erythema. No pallor.  Nursing note and vitals reviewed.    ED Treatments / Results  Labs (all labs ordered are listed, but only abnormal results are displayed) Labs Reviewed  COMPREHENSIVE METABOLIC PANEL - Abnormal; Notable for the following:       Result Value   Glucose, Bld 106 (*)    All other components within normal limits  URINALYSIS, ROUTINE W REFLEX MICROSCOPIC (NOT AT [facility]) - Abnormal; Notable for the following:    APPearance CLOUDY  (*)    Hgb urine dipstick SMALL (*)    Ketones, ur >80 (*)    All other components within normal limits  URINE MICROSCOPIC-ADD ON - Abnormal; Notable for the following:    Squamous Epithelial / LPF 6-30 (*)    Bacteria, UA FEW (*)    All other components within normal limits  LIPASE, BLOOD  CBC  PREGNANCY, URINE    EKG  EKG Interpretation None       Radiology No results found.  Procedures Procedures (including critical care time)  Medications Ordered in ED Medications  famotidine (PEPCID) IVPB 20 mg premix (20 mg Intravenous New Bag/Given 11/21/15 0442)  ondansetron (ZOFRAN) injection 4 mg (4 mg Intravenous Given 11/21/15 0232)  ranitidine (ZANTAC) 150 MG/10ML syrup 300 mg (300 mg Oral Given 11/21/15 0333)  sucralfate (CARAFATE) tablet 1 g (1 g Oral Given 11/21/15 0333)  sodium chloride 0.9 % bolus 1,000 mL (0 mLs Intravenous Stopped 11/21/15 0334)  metoCLOPramide (REGLAN) injection 10 mg (10 mg Intravenous Given 11/21/15 0410)     Initial Impression / Assessment and Plan / ED Course  I have reviewed the triage vital signs and the nursing notes.  Pertinent labs & imaging results that were available during my care of the patient were reviewed by me and considered in my medical decision making (see chart for details).  Clinical Course     Patient presents to the ED for abdominal pain.  The pain is diffuse and her abdomen is soft and nontender.  Exam is not concerning.  She was given ranitidine and sucralfate for her pain.  Labs and UA are unremarkable.    4:54 AM Patient vomited her medications.  Redosed with IV reglan and famotidine.  She appears better on repeat exam. Plan for Dc home with PCP follow up.  Initiate ranitidine daily. Final Clinical Impressions(s) / ED Diagnoses   Final diagnoses:  Gastritis    New Prescriptions New Prescriptions   RANITIDINE (ZANTAC) 150 MG CAPSULE    Take 1 capsule (150 mg total) by mouth daily.     Tomasita Crumble, MD 11/21/15 [phone]

## 2016-06-27 ENCOUNTER — Encounter: Payer: Self-pay | Admitting: Physical Therapy

## 2016-06-27 ENCOUNTER — Ambulatory Visit: Payer: No Typology Code available for payment source | Attending: Chiropractic Medicine | Admitting: Physical Therapy

## 2016-06-27 DIAGNOSIS — M62830 Muscle spasm of back: Secondary | ICD-10-CM | POA: Insufficient documentation

## 2016-06-27 DIAGNOSIS — M25561 Pain in right knee: Secondary | ICD-10-CM | POA: Insufficient documentation

## 2016-06-27 DIAGNOSIS — M542 Cervicalgia: Secondary | ICD-10-CM | POA: Diagnosis present

## 2016-06-27 DIAGNOSIS — M545 Low back pain, unspecified: Secondary | ICD-10-CM

## 2016-06-27 DIAGNOSIS — R262 Difficulty in walking, not elsewhere classified: Secondary | ICD-10-CM | POA: Diagnosis present
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
## 2016-06-30 ENCOUNTER — Ambulatory Visit: Payer: No Typology Code available for payment source | Admitting: Physical Therapy

## 2016-07-04 ENCOUNTER — Encounter: Payer: Self-pay | Admitting: Physical Therapy

## 2016-07-04 ENCOUNTER — Ambulatory Visit: Payer: No Typology Code available for payment source | Admitting: Physical Therapy

## 2016-07-04 DIAGNOSIS — M542 Cervicalgia: Secondary | ICD-10-CM

## 2016-07-04 DIAGNOSIS — M25561 Pain in right knee: Secondary | ICD-10-CM

## 2016-07-04 DIAGNOSIS — M545 Low back pain, unspecified: Secondary | ICD-10-CM

## 2016-07-04 DIAGNOSIS — M62830 Muscle spasm of back: Secondary | ICD-10-CM

## 2016-07-04 DIAGNOSIS — R262 Difficulty in walking, not elsewhere classified: Secondary | ICD-10-CM

## 2016-07-04 NOTE — Therapy (Signed)
[facility] [place], [place], [address] Phone: [phone]   Fax:  [phone]  Physical Therapy Treatment  Patient Details  Name: Carolyn Mcgrath MRN: [id number] Date of Birth: Jul 08, 1980 Referring Provider: Berta Minor  Encounter Date: 07/04/2016      PT End of Session - 07/04/16 1016    Visit Number 2   Date for PT Re-Evaluation 08/27/16   PT Start Time 0950   PT Stop Time 1016   PT Time Calculation (min) 26 min   Activity Tolerance Patient tolerated treatment well   Behavior During Therapy [facility] for tasks assessed/performed      Past Medical History:  Diagnosis Date  . Gonorrhea     Past Surgical History:  Procedure Laterality Date  . DILATION AND CURETTAGE OF UTERUS  two in 2008  . SCALP LESION REMOVAL W/ FLAP AND SKIN GRAFT  1998    There were no vitals filed for this visit.      Subjective Assessment - 07/04/16 0953    Subjective Pt reports that she feel better overall physically. She appears to be a  [place] emotional, reports a close friend was murdered this morning   Currently in Pain? No/denies   Pain Score 0-No pain   Pain Location --  starting to feel an ache in knee while on NuStep                         [facility] Adult PT Treatment/Exercise - 07/04/16 0001      Exercises   Exercises Lumbar     Lumbar Exercises: Stretches   Passive Hamstring Stretch 10 seconds;3 reps   Lower Trunk Rotation 2 reps;10 seconds   Piriformis Stretch 3 reps;10 seconds     Lumbar Exercises: Aerobic   Elliptical NuStep L3 x 6 min      Lumbar Exercises: Machines for Strengthening   Cybex Lumbar Extension 5lb 2x10    Cybex Knee Flexion 20lb 2x10      Lumbar Exercises: Standing   Row Both;15 reps;Theraband;Strengthening  x2   Theraband Level (Row) Level 2 (Red)   Shoulder Extension 15 reps;Theraband;Strengthening  x2   Theraband Level (Shoulder Extension) Level 2 (Red)                   PT Short Term Goals - 07/04/16 1018      PT SHORT TERM GOAL #1   Title independent with initial HEP   Status Partially Met           PT Long Term Goals - 07/04/16 1018      PT LONG TERM GOAL #1   Title understand proper posture and body mechanics   Status On-going     PT LONG TERM GOAL #2   Title decrease back pain 50%   Status Partially Met     PT LONG TERM GOAL #3   Title increase right knee AROM to WNL's   Status Partially Met     PT LONG TERM GOAL #4   Title increase strength Shoulders, hips and knees to 4+/5   Status On-going               Plan - 07/04/16 1016    Clinical Impression Statement Pt ~ 20 minutes late for today's treatment. Reports that she has less pain overall. No reports of increase pain with today's exercises, only a pulling sensation with exercises. Good bilat HS flexibility, does reports  piriformis stretch feels good.    Rehab Potential Good   PT Frequency 2x / week   PT Duration 8 weeks   PT Treatment/Interventions ADLs/Self Care Home Management;Cryotherapy;Electrical Stimulation;Iontophoresis 22m/ml Dexamethasone;Moist Heat;Ultrasound;Traction;Patient/family education;Therapeutic exercise;Therapeutic activities;Manual techniques   PT Next Visit Plan gym exercises and get her moving      Patient will benefit from skilled therapeutic intervention in order to improve the following deficits and impairments:  Abnormal gait, Decreased activity tolerance, Decreased strength, Decreased mobility, Decreased range of motion, Difficulty walking, Pain, Increased muscle spasms  Visit Diagnosis: Acute left-sided low back pain without sciatica  Cervicalgia  Difficulty in walking, not elsewhere classified  Muscle spasm of back  Acute pain of right knee     Problem List There are no active problems to display for this patient.   RScot Jun PTA 07/04/2016, 10:19 AM  [facility] [place] [address]Phone: [phone]  Fax:  [phone] Name: Carolyn BrickMRN: [id number] of Birth: 5June 07, 1982

## 2016-07-06 ENCOUNTER — Ambulatory Visit: Payer: No Typology Code available for payment source | Admitting: Physical Therapy

## 2016-07-06 ENCOUNTER — Encounter: Payer: Self-pay | Admitting: Physical Therapy

## 2016-07-06 DIAGNOSIS — R262 Difficulty in walking, not elsewhere classified: Secondary | ICD-10-CM

## 2016-07-06 DIAGNOSIS — M545 Low back pain, unspecified: Secondary | ICD-10-CM

## 2016-07-06 DIAGNOSIS — M25561 Pain in right knee: Secondary | ICD-10-CM

## 2016-07-06 DIAGNOSIS — M542 Cervicalgia: Secondary | ICD-10-CM

## 2016-07-06 DIAGNOSIS — M62830 Muscle spasm of back: Secondary | ICD-10-CM

## 2016-07-06 NOTE — Therapy (Signed)
[place], [place], [address] Phone: [phone]   Fax:  [phone]  Physical Therapy Treatment  Patient Details  Name: Carolyn Mcgrath MRN: [id number] Date of Birth: 05-20-1980 Referring Provider: Berta Minor  Encounter Date: 07/06/2016      PT End of Session - 07/06/16 5830    Visit Number 3   Date for PT Re-Evaluation 08/27/16   PT Start Time 0931   PT Stop Time 1026   PT Time Calculation (min) 55 min      Past Medical History:  Diagnosis Date  . Gonorrhea     Past Surgical History:  Procedure Laterality Date  . DILATION AND CURETTAGE OF UTERUS  two in 2008  . SCALP LESION REMOVAL W/ FLAP AND SKIN GRAFT  1998    There were no vitals filed for this visit.      Subjective Assessment - 07/06/16 0934    Subjective Pt reports that she has pain in her lower back. No issues since last treatment and stated that she was feeling alot better after last visit.   Currently in Pain? Yes   Pain Score 4    Pain Location Back   Pain Orientation Right;Lower                         [facility] Adult PT Treatment/Exercise - 07/06/16 0001      Lumbar Exercises: Stretches   Passive Hamstring Stretch 10 seconds;3 reps   Piriformis Stretch 3 reps;10 seconds     Lumbar Exercises: Aerobic   Elliptical NuStep L3 x 6 min      Lumbar Exercises: Machines for Strengthening   Cybex Knee Flexion 20lb 2x10    Leg Press 20lb 2x10   Other Lumbar Machine Exercise Lat pull downs 2x10 20lb   Other Lumbar Machine Exercise Lumbar extension with black theraband 2x10     Lumbar Exercises: Standing   Row --   Theraband Level (Row) --   Shoulder Extension 15 reps;Theraband;Strengthening   Theraband Level (Shoulder Extension) Level 2 (Red)     Modalities   Modalities Electrical Stimulation;Moist Heat     Moist Heat Therapy   Number Minutes Moist Heat 15 Minutes   Moist Heat Location Lumbar Spine      Electrical Stimulation   Electrical Stimulation Location left lumbar area   Electrical Stimulation Action IFC   Electrical Stimulation Parameters supine   Electrical Stimulation Goals Pain                  PT Short Term Goals - 07/04/16 1018      PT SHORT TERM GOAL #1   Title independent with initial HEP   Status Partially Met           PT Long Term Goals - 07/04/16 1018      PT LONG TERM GOAL #1   Title understand proper posture and body mechanics   Status On-going     PT LONG TERM GOAL #2   Title decrease back pain 50%   Status Partially Met     PT LONG TERM GOAL #3   Title increase right knee AROM to WNL's   Status Partially Met     PT LONG TERM GOAL #4   Title increase strength Shoulders, hips and knees to 4+/5   Status On-going               Plan - 07/06/16 1017  Clinical Impression Statement Pt was on time today. Reported some decrease in pain during warm up on nustep. No reports of increased pain during strengthening exercises. Did report tightness/pulling in low back during stretches and Leg press. Pt has good flexibility with HS stretch but does report that HS stretch feels good. After laying down during stretches she did report some back pain.   Rehab Potential Good   PT Frequency 2x / week   PT Duration 8 weeks   PT Treatment/Interventions ADLs/Self Care Home Management;Cryotherapy;Electrical Stimulation;Iontophoresis 22m/ml Dexamethasone;Moist Heat;Ultrasound;Traction;Patient/family education;Therapeutic exercise;Therapeutic activities;Manual techniques   PT Next Visit Plan gym exercises and get her moving   Consulted and Agree with Plan of Care Patient      Patient will benefit from skilled therapeutic intervention in order to improve the following deficits and impairments:  Abnormal gait, Decreased activity tolerance, Decreased strength, Decreased mobility, Decreased range of motion, Difficulty walking, Pain, Increased muscle  spasms  Visit Diagnosis: Acute left-sided low back pain without sciatica  Cervicalgia  Difficulty in walking, not elsewhere classified  Muscle spasm of back  Acute pain of right knee     Problem List There are no active problems to display for this patient.   SOctavia Bruckner4/19/2018, 10:29 AM  [facility] [address]Phone: [phone]  Fax:  [phone] Name: Carolyn GrealishMRN: [id number] of Birth: 507-20-1982

## 2016-07-11 ENCOUNTER — Encounter: Payer: Self-pay | Admitting: Physical Therapy

## 2016-07-11 ENCOUNTER — Ambulatory Visit: Payer: No Typology Code available for payment source | Admitting: Physical Therapy

## 2016-07-11 DIAGNOSIS — M545 Low back pain, unspecified: Secondary | ICD-10-CM

## 2016-07-11 DIAGNOSIS — M62830 Muscle spasm of back: Secondary | ICD-10-CM

## 2016-07-11 DIAGNOSIS — M25561 Pain in right knee: Secondary | ICD-10-CM

## 2016-07-11 DIAGNOSIS — R262 Difficulty in walking, not elsewhere classified: Secondary | ICD-10-CM

## 2016-07-11 DIAGNOSIS — M542 Cervicalgia: Secondary | ICD-10-CM

## 2016-07-11 NOTE — Therapy (Signed)
[facility] [place] [place] [address] [place], [place], [address] Phone: [phone]   Fax:  [phone]  Physical Therapy Treatment  Patient Details  Name: Latresa Gasser MRN: [id number] Date of Birth: 10-21-1980 Referring Provider: Mauri Reading  Encounter Date: 07/11/2016      PT End of Session - 07/11/16 0921    Visit Number 4   Date for PT Re-Evaluation 08/27/16   PT Start Time 0840   PT Stop Time 0934   PT Time Calculation (min) 54 min   Activity Tolerance Patient tolerated treatment well   Behavior During Therapy [facility] for tasks assessed/performed      Past Medical History:  Diagnosis Date  . Gonorrhea     Past Surgical History:  Procedure Laterality Date  . DILATION AND CURETTAGE OF UTERUS  two in 2008  . SCALP LESION REMOVAL W/ FLAP AND SKIN GRAFT  1998    There were no vitals filed for this visit.      Subjective Assessment - 07/11/16 0838    Subjective Pt reports pain in lower back and knee rated at a 4. Reports that the weather is making her knee hurt worse. Also reports that her knee locks up and causes her alot of pain. Takes tylenol PM to help with pain.    Pain Location Back  knee   Pain Orientation Right;Lower                         [facility] Adult PT Treatment/Exercise - 07/11/16 0001      Lumbar Exercises: Stretches   Active Hamstring Stretch 2 reps;20 seconds   Lower Trunk Rotation 3 reps;20 seconds     Lumbar Exercises: Aerobic   Elliptical lvl 1 fwd/2 min back   Tread Mill nustep L4x45min     Lumbar Exercises: Machines for Strengthening   Leg Press 20lb 2x10   Other Lumbar Machine Exercise Lat pull downs/rows 2x10 20lb   Other Lumbar Machine Exercise Lumbar extension with black theraband 2x10     Lumbar Exercises: Standing   Other Standing Lumbar Exercises dead lifts 3lbs 2x10     Lumbar Exercises: Supine   Bridge 10 reps  3 sets     Moist Heat Therapy   Number Minutes  Moist Heat 15 Minutes   Moist Heat Location Lumbar Spine     Electrical Stimulation   Electrical Stimulation Location lumbar   Electrical Stimulation Action IFC   Electrical Stimulation Parameters supine   Electrical Stimulation Goals Pain                  PT Short Term Goals - 07/11/16 0926      PT SHORT TERM GOAL #1   Status Achieved           PT Long Term Goals - 07/11/16 6962      PT LONG TERM GOAL #1   Title understand proper posture and body mechanics   Time 8   Period Weeks   Status On-going     PT LONG TERM GOAL #2   Title decrease back pain 50%     PT LONG TERM GOAL #3   Title increase right knee AROM to WNL's               Plan - 07/11/16 0923    Clinical Impression Statement Pt. completed all exercises well tolday. Reported decrease in pain and a good stretch in her back after  all exercises. Pt. states that she works out some. Could possibly increase resistance to challenge pt. for next session.    Rehab Potential Good   PT Frequency 2x / week   PT Duration 8 weeks   PT Treatment/Interventions ADLs/Self Care Home Management;Cryotherapy;Electrical Stimulation;Iontophoresis /ml Dexamethasone;Moist Heat;Ultrasound;Traction;Patient/family education;Therapeutic exercise;Therapeutic activities;Manual techniques   PT Next Visit Plan gym exercises and get her moving, focus on knee pain      Patient will benefit from skilled therapeutic intervention in order to improve the following deficits and impairments:  Abnormal gait, Decreased activity tolerance, Decreased strength, Decreased mobility, Decreased range of motion, Difficulty walking, Pain, Increased muscle spasms  Visit Diagnosis: Acute left-sided low back pain without sciatica  Cervicalgia  Difficulty in walking, not elsewhere classified  Muscle spasm of back  Acute pain of right knee     Problem List There are no active problems to display for this patient.   Dorothyann Gibbs 07/11/2016, 9:27 AM  [facility] [address] [place], [place], [address] Phone: [phone]   Fax:  [phone]  Name: Moncerrath Berhe MRN: [id number] Date of Birth: [birth year]

## 2016-07-13 ENCOUNTER — Ambulatory Visit: Payer: No Typology Code available for payment source | Admitting: Physical Therapy

## 2016-07-18 ENCOUNTER — Ambulatory Visit: Payer: No Typology Code available for payment source | Admitting: Physical Therapy

## 2016-07-21 ENCOUNTER — Encounter: Payer: Self-pay | Admitting: Physical Therapy

## 2016-07-21 ENCOUNTER — Encounter: Payer: Medicaid Other | Admitting: Physical Therapy

## 2016-07-21 ENCOUNTER — Ambulatory Visit: Payer: No Typology Code available for payment source | Attending: Chiropractic Medicine | Admitting: Physical Therapy

## 2016-07-21 DIAGNOSIS — R262 Difficulty in walking, not elsewhere classified: Secondary | ICD-10-CM

## 2016-07-21 DIAGNOSIS — M545 Low back pain, unspecified: Secondary | ICD-10-CM

## 2016-07-21 DIAGNOSIS — M25561 Pain in right knee: Secondary | ICD-10-CM | POA: Diagnosis present

## 2016-07-21 DIAGNOSIS — M62830 Muscle spasm of back: Secondary | ICD-10-CM | POA: Insufficient documentation

## 2016-07-21 NOTE — Therapy (Addendum)
[place], [place], [address] Phone: [phone]   Fax:  [phone]  Physical Therapy Treatment  Patient Details  Name: Carolyn Mcgrath MRN: [id number] Date of Birth: 1980/12/07 Referring Provider: Berta Minor  Encounter Date: 07/21/2016      PT End of Session - 07/21/16 0925    Visit Number 5   Date for PT Re-Evaluation 08/27/16   PT Start Time 0840   PT Stop Time 0937   PT Time Calculation (min) 57 min   Activity Tolerance Patient tolerated treatment well   Behavior During Therapy [facility] for tasks assessed/performed      Past Medical History:  Diagnosis Date  . Gonorrhea     Past Surgical History:  Procedure Laterality Date  . DILATION AND CURETTAGE OF UTERUS  two in 2008  . SCALP LESION REMOVAL W/ FLAP AND SKIN GRAFT  1998    There were no vitals filed for this visit.      Subjective Assessment - 07/21/16 0835    Subjective Pt reports that she has pain in her knee. States that she was standing for a long time yesterday and thinks that may be what has caused it. No issues reported since last visit. Reports that she isn't having any problems with her back today.    Currently in Pain? Yes   Pain Score 4                          [facility] Adult PT Treatment/Exercise - 07/21/16 0001      Lumbar Exercises: Stretches   Single Knee to Chest Stretch 2 reps;20 seconds   Lower Trunk Rotation 3 reps;20 seconds   Piriformis Stretch 3 reps;20 seconds     Lumbar Exercises: Aerobic   Elliptical lvl4 67fd/2back   Tread Mill nustep L4x611m     Lumbar Exercises: Machines for Strengthening   Cybex Knee Extension 10lb2x10   Cybex Knee Flexion 20lb 2x10   Leg Press 40lb 2x10   Other Lumbar Machine Exercise Lat pull downs/rows 2x10 20lb   Other Lumbar Machine Exercise Lumbar extension     Lumbar Exercises: Standing   Other Standing Lumbar Exercises standing trunk rotation and back  extension with yellow ball 2x10      Moist Heat Therapy   Number Minutes Moist Heat 15 Minutes   Moist Heat Location Lumbar Spine  right knee     Electrical Stimulation   Electrical Stimulation Location Lumbar /rigth knee   Electrical Stimulation Action Premod   Electrical Stimulation Parameters pt tolerance   Electrical Stimulation Goals Pain                  PT Short Term Goals - 07/21/16 0847      PT SHORT TERM GOAL #1   Title independent with initial HEP   Status Achieved           PT Long Term Goals - 07/21/16 0931      PT LONG TERM GOAL #1   Title understand proper posture and body mechanics   Status Partially Met               Plan - 07/21/16 0925    Clinical Impression Statement Pt completed all exercises well today. Reported increase in tightness in back after all exercises but reported a decrease in pain in R knee. Ended treatment with E-stim and moist heat on R knee and back to  help relax muscle and decrease pain.    PT Treatment/Interventions ADLs/Self Care Home Management;Cryotherapy;Electrical Stimulation;Iontophoresis 103m/ml Dexamethasone;Moist Heat;Ultrasound;Traction;Patient/family education;Therapeutic exercise;Therapeutic activities;Manual techniques   PT Next Visit Plan continue adding gym exercises and progress in resistance.       Patient will benefit from skilled therapeutic intervention in order to improve the following deficits and impairments:     Visit Diagnosis: Acute left-sided low back pain without sciatica  Difficulty in walking, not elsewhere classified  Muscle spasm of back  Acute pain of right knee     Problem List There are no active problems to display for this patient.  PHYSICAL THERAPY DISCHARGE SUMMARY  Visits from Start of Care: 5  Plan: Patient agrees to discharge.  Patient goals were not met. Patient is being discharged due to not returning since the last visit.  ?????       SOctavia Bruckner5/06/2016, 9:38 AM  [facility] [address]Phone: [phone]  Fax:  [phone] Name: MAlima NaserMRN: [id number] of Birth: [birth year]

## 2016-07-25 ENCOUNTER — Ambulatory Visit: Payer: No Typology Code available for payment source | Admitting: Physical Therapy

## 2016-07-27 ENCOUNTER — Ambulatory Visit: Payer: No Typology Code available for payment source | Admitting: Physical Therapy

## 2019-03-28 ENCOUNTER — Other Ambulatory Visit: Payer: Self-pay

## 2019-03-28 ENCOUNTER — Emergency Department (HOSPITAL_BASED_OUTPATIENT_CLINIC_OR_DEPARTMENT_OTHER)
Admission: EM | Admit: 2019-03-28 | Discharge: 2019-03-28 | Disposition: A | Discharge status: Home / Self Care | Payer: Medicaid Other | Attending: Emergency Medicine | Admitting: Emergency Medicine

## 2019-03-28 ENCOUNTER — Encounter (HOSPITAL_BASED_OUTPATIENT_CLINIC_OR_DEPARTMENT_OTHER): Payer: Self-pay | Admitting: *Deleted

## 2019-03-28 DIAGNOSIS — R519 Headache, unspecified: Secondary | ICD-10-CM | POA: Diagnosis present

## 2019-03-28 DIAGNOSIS — F17210 Nicotine dependence, cigarettes, uncomplicated: Secondary | ICD-10-CM | POA: Diagnosis not present

## 2019-03-28 DIAGNOSIS — L905 Scar conditions and fibrosis of skin: Secondary | ICD-10-CM | POA: Diagnosis not present

## 2019-03-28 DIAGNOSIS — Z79899 Other long term (current) drug therapy: Secondary | ICD-10-CM | POA: Insufficient documentation

## 2019-03-28 MED ORDER — IBUPROFEN 400 MG PO TABS
600.0000 mg | ORAL_TABLET | Freq: Once | ORAL | Status: AC
  Administered 2019-03-28: 600 mg via ORAL
  Filled 2019-03-28: qty 1

## 2019-03-28 MED ORDER — IBUPROFEN 600 MG PO TABS: 600.0000 mg | ORAL_TABLET | Freq: Three times a day (TID) | ORAL | 0 refills | Status: AC | PRN

## 2019-03-28 NOTE — ED Notes (Signed)
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

## 2019-03-28 NOTE — ED Triage Notes (Signed)
Pain and a "lump" felt in the location of an old scar to her right scalp.

## 2019-03-28 NOTE — Discharge Instructions (Addendum)
You can apply Lidoderm cream, available over-the-counter to the part of your scalp that is painful. Use according to label instructions.

## 2019-03-28 NOTE — ED Provider Notes (Signed)
[facility] EMERGENCY DEPARTMENT Provider Note   CSN: [id number] Arrival date & time: 03/28/19  1137     History Chief Complaint  Patient presents with  . Skin Lesion    Carolyn Mcgrath is a 39 y.o. female.  The history is provided by the patient. No language interpreter was used.   Carolyn Mcgrath is a 39 y.o. female who presents to the Emergency Department complaining of headache, scalp scar. She presents the emergency department complaining of burning pain in the back of her head at the area of a scar. She had a birthmark removed at the age of 88 and has experienced intermittent burning since that time. Today around 6 AM she awoke with burning in her posterior scalp with associated headache in that area. She denies any fevers, nausea, vomiting, numbness, weakness, chest pain, shortness of breath. She did not take any medications. She has no PCP. No known COVID 19 exposures. Symptoms are moderate and constant in nature.    Past Medical History:  Diagnosis Date  . Gonorrhea     There are no problems to display for this patient.   Past Surgical History:  Procedure Laterality Date  . DILATION AND CURETTAGE OF UTERUS  two in 2008  . SCALP LESION REMOVAL W/ FLAP AND SKIN GRAFT  1998     OB History   No obstetric history on file.     No family history on file.  Social History   Tobacco Use  . Smoking status: Current Every Day Smoker    Packs/day: 0.50    Types: Cigarettes  . Smokeless tobacco: Never Used  Substance Use Topics  . Alcohol use: Yes    Alcohol/week: 14.0 standard drinks    Types: 14 Cans of beer per week    Comment: 2 beers daily  . Drug use: Yes    Types: Marijuana    Home Medications Prior to Admission medications   Medication Sig Start Date End Date Taking? Authorizing Provider  famotidine (PEPCID) 20 MG tablet Take 1 tablet (20 mg total) by mouth 2 (two) times daily. 07/24/15   Forde Dandy, MD  hydrOXYzine (ATARAX/VISTARIL) 10 MG  tablet Take 10 mg by mouth 3 (three) times daily as needed.    [provider]  ibuprofen (ADVIL) 600 MG tablet Take 1 tablet (600 mg total) by mouth every 8 (eight) hours as needed. 03/28/19   Quintella Reichert, MD  ondansetron (ZOFRAN) 4 MG tablet Take 1 tablet (4 mg total) by mouth every 8 (eight) hours as needed for nausea or vomiting. 07/24/15   Forde Dandy, MD  ranitidine (ZANTAC) 150 MG capsule Take 1 capsule (150 mg total) by mouth daily. 11/21/15   Everlene Balls, MD    Allergies    Tomato  Review of Systems   Review of Systems  All other systems reviewed and are negative.   Physical Exam Updated Vital Signs BP 128/88   Pulse (!) 107   Temp 98.6 F (37 C) (Oral)   Resp 16   Ht 5\' 3"  (1.6 m)   Wt 47.6 kg   SpO2 98%   BMI 18.60 kg/m   Physical Exam Vitals and nursing note reviewed.  Constitutional:      Appearance: She is well-developed.  HENT:     Head: Normocephalic and atraumatic.     Comments: The right occipital scalp has an area absent of skin growth. There is mild swelling in this area without any erythema, fluctuance.   Cardiovascular:  Rate and Rhythm: Normal rate and regular rhythm.     Heart sounds: No murmur.  Pulmonary:     Effort: Pulmonary effort is normal. No respiratory distress.     Breath sounds: Normal breath sounds.  Abdominal:     Palpations: Abdomen is soft.     Tenderness: There is no abdominal tenderness. There is no guarding or rebound.  Musculoskeletal:        General: No tenderness.     Cervical back: Neck supple.  Skin:    General: Skin is warm and dry.  Neurological:     Mental Status: She is alert and oriented to person, place, and time.     Comments: Pupils equal round and reactive, EOM I. Five out of five strength in all four extremities with sensation to light touch intact in all four extremities  Psychiatric:        Behavior: Behavior normal.     ED Results / Procedures / Treatments   Labs (all labs ordered are  listed, but only abnormal results are displayed) Labs Reviewed - No data to display  EKG None  Radiology No results found.  Procedures Procedures (including critical care time)  Medications Ordered in ED Medications  ibuprofen (ADVIL) tablet 600 mg (has no administration in time range)    ED Course  I have reviewed the triage vital signs and the nursing notes.  Pertinent labs & imaging results that were available during my care of the patient were reviewed by me and considered in my medical decision making (see chart for details).    MDM Rules/Calculators/A&P                     Patient here for evaluation of headache, scalp pain. She has a history of scar in the area of her symptoms. She does have a scar presents with no evidence of overlying infection. Presentation is not consistent with meningitis, subarachnoid hemorrhage, dural sinus thrombosis. Discussed with patient home care for headache as well as hyperalgesia at her scar. Discussed outpatient follow-up and return precautions.  Carolyn Mcgrath was evaluated in Emergency Department on 03/28/2019 for the symptoms described in the history of present illness. She was evaluated in the context of the global COVID-19 pandemic, which necessitated consideration that the patient might be at risk for infection with the SARS-CoV-2 virus that causes COVID-19. Institutional protocols and algorithms that pertain to the evaluation of patients at risk for COVID-19 are in a state of rapid change based on information released by regulatory bodies including the CDC and federal and state organizations. These policies and algorithms were followed during the patient's care in the ED.   Final Clinical Impression(s) / ED Diagnoses Final diagnoses:  Bad headache  Scar of scalp    Rx / DC Orders ED Discharge Orders         Ordered    ibuprofen (ADVIL) 600 MG tablet  Every 8 hours PRN     03/28/19 1208           Tilden Fossa, MD 03/28/19  1222
